# Patient Record
Sex: Male | Born: 1955
Health system: Southern US, Community
[De-identification: ages and names within clinical notes are randomized; demographics above are authoritative.]

## PROBLEM LIST (undated history)

## (undated) DIAGNOSIS — I10 Essential (primary) hypertension: Secondary | ICD-10-CM

## (undated) DIAGNOSIS — E785 Hyperlipidemia, unspecified: Secondary | ICD-10-CM

## (undated) DIAGNOSIS — M069 Rheumatoid arthritis, unspecified: Secondary | ICD-10-CM

## (undated) DIAGNOSIS — T7840XA Allergy, unspecified, initial encounter: Secondary | ICD-10-CM

## (undated) HISTORY — DX: Essential (primary) hypertension: I10

## (undated) HISTORY — DX: Rheumatoid arthritis, unspecified: M06.9

## (undated) HISTORY — DX: Allergy, unspecified, initial encounter: T78.40XA

## (undated) HISTORY — PX: HERNIA REPAIR: SHX51

## (undated) HISTORY — DX: Hyperlipidemia, unspecified: E78.5

---

## 2002-10-07 ENCOUNTER — Encounter: Admission: RE | Admit: 2002-10-07 | Discharge: 2002-10-07 | Payer: Self-pay | Admitting: Internal Medicine

## 2002-10-07 ENCOUNTER — Encounter: Payer: Self-pay | Admitting: Internal Medicine

## 2004-04-19 ENCOUNTER — Ambulatory Visit: Payer: Self-pay | Admitting: Family Medicine

## 2007-07-06 ENCOUNTER — Ambulatory Visit: Payer: Self-pay | Admitting: Family Medicine

## 2007-07-06 DIAGNOSIS — L509 Urticaria, unspecified: Secondary | ICD-10-CM | POA: Insufficient documentation

## 2007-07-06 DIAGNOSIS — Z9889 Other specified postprocedural states: Secondary | ICD-10-CM | POA: Insufficient documentation

## 2007-07-19 ENCOUNTER — Encounter (INDEPENDENT_AMBULATORY_CARE_PROVIDER_SITE_OTHER): Payer: Self-pay | Admitting: *Deleted

## 2007-07-24 ENCOUNTER — Ambulatory Visit: Payer: Self-pay | Admitting: Gastroenterology

## 2007-08-01 LAB — HM COLONOSCOPY: HM Colonoscopy: NORMAL

## 2007-08-07 ENCOUNTER — Ambulatory Visit: Payer: Self-pay | Admitting: Gastroenterology

## 2007-08-07 ENCOUNTER — Encounter: Payer: Self-pay | Admitting: Family Medicine

## 2007-09-11 ENCOUNTER — Ambulatory Visit: Payer: Self-pay | Admitting: Internal Medicine

## 2007-09-11 ENCOUNTER — Telehealth: Payer: Self-pay | Admitting: Internal Medicine

## 2007-09-11 ENCOUNTER — Observation Stay (HOSPITAL_COMMUNITY): Admission: EM | Admit: 2007-09-11 | Discharge: 2007-09-12 | Payer: Self-pay | Admitting: Emergency Medicine

## 2007-09-11 DIAGNOSIS — R42 Dizziness and giddiness: Secondary | ICD-10-CM

## 2007-09-11 DIAGNOSIS — R55 Syncope and collapse: Secondary | ICD-10-CM

## 2007-09-11 DIAGNOSIS — I10 Essential (primary) hypertension: Secondary | ICD-10-CM | POA: Insufficient documentation

## 2007-09-18 ENCOUNTER — Ambulatory Visit: Payer: Self-pay | Admitting: Internal Medicine

## 2007-09-18 DIAGNOSIS — R030 Elevated blood-pressure reading, without diagnosis of hypertension: Secondary | ICD-10-CM

## 2007-10-19 ENCOUNTER — Ambulatory Visit: Payer: Self-pay | Admitting: Family Medicine

## 2007-10-22 ENCOUNTER — Encounter: Payer: Self-pay | Admitting: Family Medicine

## 2007-10-22 ENCOUNTER — Encounter (INDEPENDENT_AMBULATORY_CARE_PROVIDER_SITE_OTHER): Payer: Self-pay | Admitting: *Deleted

## 2007-11-02 ENCOUNTER — Encounter (INDEPENDENT_AMBULATORY_CARE_PROVIDER_SITE_OTHER): Payer: Self-pay | Admitting: *Deleted

## 2008-08-29 ENCOUNTER — Ambulatory Visit: Payer: Self-pay | Admitting: Family Medicine

## 2008-08-29 DIAGNOSIS — E785 Hyperlipidemia, unspecified: Secondary | ICD-10-CM

## 2008-09-01 ENCOUNTER — Encounter (INDEPENDENT_AMBULATORY_CARE_PROVIDER_SITE_OTHER): Payer: Self-pay | Admitting: *Deleted

## 2008-09-01 ENCOUNTER — Encounter: Payer: Self-pay | Admitting: Family Medicine

## 2008-09-10 ENCOUNTER — Encounter (INDEPENDENT_AMBULATORY_CARE_PROVIDER_SITE_OTHER): Payer: Self-pay | Admitting: *Deleted

## 2008-09-15 ENCOUNTER — Ambulatory Visit: Payer: Self-pay | Admitting: Family Medicine

## 2008-09-16 ENCOUNTER — Encounter (INDEPENDENT_AMBULATORY_CARE_PROVIDER_SITE_OTHER): Payer: Self-pay | Admitting: *Deleted

## 2008-10-23 ENCOUNTER — Telehealth: Payer: Self-pay | Admitting: Family Medicine

## 2008-11-03 ENCOUNTER — Telehealth: Payer: Self-pay | Admitting: Family Medicine

## 2008-12-05 ENCOUNTER — Telehealth (INDEPENDENT_AMBULATORY_CARE_PROVIDER_SITE_OTHER): Payer: Self-pay | Admitting: *Deleted

## 2008-12-22 ENCOUNTER — Telehealth (INDEPENDENT_AMBULATORY_CARE_PROVIDER_SITE_OTHER): Payer: Self-pay | Admitting: *Deleted

## 2008-12-22 DIAGNOSIS — R21 Rash and other nonspecific skin eruption: Secondary | ICD-10-CM

## 2008-12-31 ENCOUNTER — Encounter: Payer: Self-pay | Admitting: Family Medicine

## 2009-03-23 ENCOUNTER — Telehealth (INDEPENDENT_AMBULATORY_CARE_PROVIDER_SITE_OTHER): Payer: Self-pay | Admitting: *Deleted

## 2009-11-24 ENCOUNTER — Ambulatory Visit: Payer: Self-pay | Admitting: Family Medicine

## 2009-12-14 ENCOUNTER — Ambulatory Visit: Payer: Self-pay | Admitting: Diagnostic Radiology

## 2009-12-14 ENCOUNTER — Ambulatory Visit: Payer: Self-pay | Admitting: Family Medicine

## 2009-12-14 ENCOUNTER — Ambulatory Visit (HOSPITAL_BASED_OUTPATIENT_CLINIC_OR_DEPARTMENT_OTHER): Admission: RE | Admit: 2009-12-14 | Discharge: 2009-12-14 | Payer: Self-pay | Admitting: Family Medicine

## 2009-12-14 DIAGNOSIS — M545 Low back pain: Secondary | ICD-10-CM

## 2009-12-29 ENCOUNTER — Ambulatory Visit: Payer: Self-pay | Admitting: Family Medicine

## 2010-02-17 ENCOUNTER — Ambulatory Visit: Payer: Self-pay | Admitting: Family Medicine

## 2010-02-17 ENCOUNTER — Encounter: Payer: Self-pay | Admitting: Family Medicine

## 2010-02-17 DIAGNOSIS — D239 Other benign neoplasm of skin, unspecified: Secondary | ICD-10-CM | POA: Insufficient documentation

## 2010-02-17 DIAGNOSIS — F528 Other sexual dysfunction not due to a substance or known physiological condition: Secondary | ICD-10-CM

## 2010-02-19 LAB — CONVERTED CEMR LAB
Sex Hormone Binding: 34 nmol/L (ref 13–71)
Testosterone-% Free: 2 % (ref 1.6–2.9)

## 2010-03-03 ENCOUNTER — Ambulatory Visit: Payer: Self-pay | Admitting: Family Medicine

## 2010-03-03 DIAGNOSIS — T7840XA Allergy, unspecified, initial encounter: Secondary | ICD-10-CM | POA: Insufficient documentation

## 2010-03-16 ENCOUNTER — Ambulatory Visit: Payer: Self-pay | Admitting: Family Medicine

## 2010-04-15 ENCOUNTER — Telehealth (INDEPENDENT_AMBULATORY_CARE_PROVIDER_SITE_OTHER): Payer: Self-pay | Admitting: *Deleted

## 2010-06-08 ENCOUNTER — Ambulatory Visit
Admission: RE | Admit: 2010-06-08 | Discharge: 2010-06-08 | Payer: Self-pay | Source: Home / Self Care | Attending: Family Medicine | Admitting: Family Medicine

## 2010-06-13 LAB — CONVERTED CEMR LAB
ALT: 41 units/L (ref 0–53)
Albumin: 4.5 g/dL (ref 3.5–5.2)
Albumin: 4.5 g/dL (ref 3.5–5.2)
Alkaline Phosphatase: 73 units/L (ref 39–117)
BUN: 10 mg/dL (ref 6–23)
BUN: 10 mg/dL (ref 6–23)
Basophils Absolute: 0 10*3/uL (ref 0.0–0.1)
Basophils Relative: 0.2 % (ref 0.0–3.0)
Basophils Relative: 0.3 % (ref 0.0–3.0)
Bilirubin, Direct: 0.1 mg/dL (ref 0.0–0.3)
Blood in Urine, dipstick: NEGATIVE
Blood in Urine, dipstick: NEGATIVE
Chloride: 106 meq/L (ref 96–112)
Cholesterol: 182 mg/dL (ref 0–200)
Eosinophils Absolute: 0.2 10*3/uL (ref 0.0–0.6)
Eosinophils Absolute: 0.2 10*3/uL (ref 0.0–0.7)
Eosinophils Relative: 3.5 % (ref 0.0–5.0)
Eosinophils Relative: 3.8 % (ref 0.0–5.0)
GFR calc non Af Amer: 75 mL/min
Glucose, Bld: 97 mg/dL (ref 70–99)
HCT: 44.6 % (ref 39.0–52.0)
HCT: 44.8 % (ref 39.0–52.0)
HDL: 33.7 mg/dL — ABNORMAL LOW (ref >39.0)
Hemoglobin: 15.1 g/dL (ref 13.0–17.0)
Hemoglobin: 15.6 g/dL (ref 13.0–17.0)
Ketones, urine, test strip: NEGATIVE
Lymphs Abs: 1 10*3/uL (ref 0.7–4.0)
MCHC: 33.8 g/dL (ref 30.0–36.0)
MCHC: 35.3 g/dL (ref 30.0–36.0)
MCV: 92.2 fL (ref 78.0–100.0)
MCV: 92.5 fL (ref 78.0–100.0)
MCV: 92.7 fL (ref 78.0–100.0)
Monocytes Absolute: 0.4 10*3/uL (ref 0.1–1.0)
Monocytes Absolute: 0.4 10*3/uL (ref 0.1–1.0)
Monocytes Absolute: 0.5 10*3/uL (ref 0.2–0.7)
Monocytes Relative: 9.3 % (ref 3.0–11.0)
Neutro Abs: 3 10*3/uL (ref 1.4–7.7)
Neutrophils Relative %: 61 % (ref 43.0–77.0)
Nitrite: NEGATIVE
Nitrite: NEGATIVE
PSA: 1.15 ng/mL (ref 0.10–4.00)
PSA: 1.27 ng/mL (ref 0.10–4.00)
Platelets: 333 10*3/uL (ref 150.0–400.0)
Potassium: 4.3 meq/L (ref 3.5–5.1)
Potassium: 4.4 meq/L (ref 3.5–5.1)
Protein, U semiquant: NEGATIVE
Protein, U semiquant: NEGATIVE
RBC: 4.75 M/uL (ref 4.22–5.81)
RBC: 4.84 M/uL (ref 4.22–5.81)
Sodium: 140 meq/L (ref 135–145)
Specific Gravity, Urine: 1.01
TSH: 1.89 microintl units/mL (ref 0.35–5.50)
TSH: 2.29 microintl units/mL (ref 0.35–5.50)
Total Bilirubin: 0.8 mg/dL (ref 0.3–1.2)
Total Bilirubin: 1 mg/dL (ref 0.3–1.2)
Total CHOL/HDL Ratio: 5.4
Total Protein: 7.6 g/dL (ref 6.0–8.3)
Urobilinogen, UA: NEGATIVE
Urobilinogen, UA: NEGATIVE
WBC Urine, dipstick: NEGATIVE
WBC: 4.4 10*3/uL — ABNORMAL LOW (ref 4.5–10.5)

## 2010-06-15 NOTE — Assessment & Plan Note (Signed)
Summary: lower back pain/cbs   Vital Signs:  Patient profile:   55 year old male Height:      66.50 inches (168.91 cm) Weight:      183.38 pounds (83.35 kg) BMI:     29.26 Temp:     98.3 degrees F (36.83 degrees C) oral BP sitting:   140 / 88  (right arm) Cuff size:   regular  Vitals Entered By: Lucious Groves CMA (December 14, 2009 11:56 AM) CC: C/O lower back pain since yesterday./kb, Back Pain Is Patient Diabetic? No Pain Assessment Patient in pain? yes     Location: back Intensity: 8 Type: aching Onset of pain  yesterday Comments Patient states that he injured himself while coughing./kb   History of Present Illness:       This is a 55 year old man who presents with Back Pain.  The symptoms began 1 day ago.  Pt c/o sudden onset back pain after coughing and feeling pop in back yesterday.  The patient denies fever, chills, weakness, loss of sensation, fecal incontinence, urinary incontinence, urinary retention, dysuria, rest pain, inability to work, and inability to care for self.  The pain is located in the mid low back.  The pain began at home, suddenly, and after straining.  The pain radiates to the left hip.  The pain is made worse by activity.  The pain is made better by inactivity and ice.    Current Medications (verified): 1)  None 2)  Flexeril 10 Mg Tabs (Cyclobenzaprine Hcl) .Marland Kitchen.. 1 By Mouth Three Times A Day 3)  Vicodin Es 7.5-750 Mg Tabs (Hydrocodone-Acetaminophen) .Marland Kitchen.. 1 By Mouth Every 6 Hours As Needed  Allergies (verified): No Known Drug Allergies  Past History:  Past medical, surgical, family and social histories (including risk factors) reviewed for relevance to current acute and chronic problems.  Past Medical History: Reviewed history from 08/29/2008 and no changes required. Urticaria Current Problems:  HYPERTENSION, ESSENTIAL NOS (ICD-401.9) DIZZINESS (ICD-780.4) VERTIGO (ICD-780.4) SYNCOPE (ICD-780.2) NAUSEA (ICD-787.02) VOMITING  (ICD-787.03) PREVENTIVE HEALTH CARE (ICD-V70.0) FAMILY HISTORY DIABETES 1ST DEGREE RELATIVE (ICD-V18.0) HERNIORRHAPHY, HX OF (ICD-V45.89) URTICARIA (ICD-708.9) Hyperlipidemia  Past Surgical History: Reviewed history from 09/18/2007 and no changes required. Herniorrhaphy  Family History: Reviewed history from 08/29/2008 and no changes required. F--Oral CAncer, angioplasty-- expired from cancer Family History Diabetes 1st degree relative M--dementia  Social History: Reviewed history from 07/06/2007 and no changes required. 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   Neurologic:  alert & oriented X3, strength normal in all extremities, gait normal, and DTRs symmetrical and normal.  + SLR b/L  Psych:  Oriented X3 and normally interactive.     Impression & Recommendations:  Problem # 1:  LOW BACK PAIN, ACUTE (ICD-724.2)  His updated medication list for this problem includes:    Flexeril 10 Mg Tabs (Cyclobenzaprine hcl) .Marland Kitchen... 1 by mouth three times a day    Vicodin Es 7.5-750 Mg Tabs (Hydrocodone-acetaminophen) .Marland Kitchen... 1 by mouth every 6 hours as needed  Orders: T-Lumbar Spine 2 Views (72100TC)  Discussed use of moist heat or ice, modified activities, medications, and stretching/strengthening exercises. Back care instructions given. To be seen in 2 weeks if no improvement; sooner  if worsening of symptoms.   Complete Medication List: 1)  None  2)  Flexeril 10 Mg Tabs (Cyclobenzaprine hcl) .Marland Kitchen.. 1 by mouth three times a day 3)  Vicodin Es 7.5-750 Mg Tabs (Hydrocodone-acetaminophen) .Marland Kitchen.. 1 by mouth every 6 hours as needed  Patient  Instructions: 1)  Most patients (90%) with low back pain will improve with time ( 2-6 weeks). Keep active but avoid activities that are painful. Apply moist heat and/or ice to lower back several times a day.  Prescriptions: VICODIN ES 7.5-750 MG TABS (HYDROCODONE-ACETAMINOPHEN) 1 by mouth every 6 hours as needed  #30 x 0   Entered and Authorized by:   Loreen Freud DO   Signed by:   Loreen Freud DO on 12/14/2009   Method used:   Print then Give to Patient   RxID:   [id number] FLEXERIL 10 MG TABS (CYCLOBENZAPRINE HCL) 1 by mouth three times a day  #30 x 0   Entered and Authorized by:   Loreen Freud DO   Signed by:   Loreen Freud DO on 12/14/2009   Method used:   Print then Give to Patient   RxID:   [id number]

## 2010-06-15 NOTE — Assessment & Plan Note (Signed)
Summary: 2ND HEP   Nurse Visit   Allergies: No Known Drug Allergies  Immunizations Administered:  TwinRix # 2:    Vaccine Type: TwinRix    Site: left deltoid    Mfr: GlaxoSmithKline    Dose: 1.0 ml    Route: IM    Given by: Almeta Monas CMA (AAMA)    Exp. Date: 07/25/2011    Lot #: [id number]    VIS given: 02/01/07 version given December 29, 2009.  Orders Added: 1)  TwinRix 1ml ( Hep A&B Adult dose) [90636] 2)  Admin 1st Vaccine [90471]

## 2010-06-15 NOTE — Progress Notes (Signed)
Summary: refill   Phone Note Refill Request Message from:  Patient on April 15, 2010 11:37 AM  Refills Requested: Medication #1:  CRESTOR 10 MG TABS 1 by mouth at bedtime. patient was given samples of crestor - he said med seems to be agreeing - wants 3 ,month supply - [facility]  Initial call taken by: Okey Regal [place],  April 15, 2010 11:40 AM    Prescriptions: CRESTOR 10 MG TABS (ROSUVASTATIN CALCIUM) 1 by mouth at bedtime  #90 x 1   Entered by:   Almeta Monas CMA (AAMA)   Authorized by:   Loreen Freud DO   Signed by:   Almeta Monas CMA (AAMA) on 04/15/2010   Method used:   Faxed to ...       [facility] education [facility])       [address] [address]       [place], [place]  [address]       Ph: [phone]       Fax: [phone]   RxID:   [id number]

## 2010-06-15 NOTE — Assessment & Plan Note (Signed)
Summary: cpx / /cbs   Vital Signs:  Patient profile:   55 year old male Height:      66.50 inches Weight:      186.8 pounds BMI:     29.81 Temp:     98.7 degrees F oral Pulse rate:   68 / minute Pulse rhythm:   regular BP sitting:   122 / 80  (right arm) Cuff size:   regular  Vitals Entered By: Almeta Monas CMA Duncan Dull) (February 17, 2010 9:12 AM) CC: CPX/Fasting   History of Present Illness: Pt here for cpe.  No complaints.    Preventive Screening-Counseling & Management  Alcohol-Tobacco     Alcohol drinks/day: 3     Alcohol type: beer     >5/day in last 3 mos: yes     Alcohol Counseling: to decrease amount and/or frequency of alcohol intake     Feels need to cut down: no     Feels annoyed by complaints: no     Feels guilty re: drinking: no     Needs 'eye opener' in am: no     Smoking Status: never     Passive Smoke Exposure: no  Caffeine-Diet-Exercise     Caffeine use/day: 2     Caffeine Counseling: not indicated; caffeine use is not excessive or problematic     Does Patient Exercise: no     Exercise Counseling: to improve exercise regimen  Hep-HIV-STD-Contraception     Hepatitis Risk: no risk noted     Hepatitis Risk Counseling: not indicated-no hepatitis risk noted     HIV Risk: no risk noted     HIV Risk Counseling: not indicated-no HIV risk noted     STD Risk: no risk noted     STD Risk Counseling: not indicated-no STD risk noted     Dental Visit-last 6 months yes     Dental Care Counseling: not indicated; dental care within six months     Sun Exposure-Excessive: occasionally  Safety-Violence-Falls     Seat Belt Use: yes     Firearms in the Home: no firearms in the home     Smoke Detectors: yes     Violence in the Home: no risk noted     Sexual Abuse: no      Sexual History:  currently monogamous.    Current Medications (verified): 1)  None 2)  Cialis 20 Mg Tabs (Tadalafil) .... As Directed  Allergies (verified): No Known Drug Allergies  Past  History:  Past Medical History: Last updated: 08/29/2008 Urticaria Current Problems:  HYPERTENSION, ESSENTIAL NOS (ICD-401.9) DIZZINESS (ICD-780.4) VERTIGO (ICD-780.4) SYNCOPE (ICD-780.2) NAUSEA (ICD-787.02) VOMITING (ICD-787.03) PREVENTIVE HEALTH CARE (ICD-V70.0) FAMILY HISTORY DIABETES 1ST DEGREE RELATIVE (ICD-V18.0) HERNIORRHAPHY, HX OF (ICD-V45.89) URTICARIA (ICD-708.9) Hyperlipidemia  Past Surgical History: Last updated: 09/18/2007 Herniorrhaphy  Family History: Last updated: 08/29/2008 F--Oral CAncer, angioplasty-- expired from cancer Family History Diabetes 1st degree relative M--dementia  Social History: Last updated: 07/06/2007 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 F--Oral CAncer, angioplasty-- expired from cancer Family History Diabetes 1st degree relative M--dementia  Social History: Reviewed history from 07/06/2007 and no changes required. 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 Eyes:  Denies blurring, discharge, double vision, eye irritation, eye pain, halos, itching, light sensitivity, red eye, vision loss-1 eye, and vision loss-both eyes. ENT:  Denies decreased hearing, difficulty swallowing, ear discharge, earache, hoarseness, nasal congestion, nosebleeds, postnasal drainage, ringing in ears, sinus pressure, and sore throat. CV:  Denies bluish discoloration of  lips or nails, chest pain or discomfort, difficulty breathing at night, difficulty breathing while lying down, fainting, fatigue, leg cramps with exertion, lightheadness, near fainting, palpitations, shortness of breath with exertion, swelling of feet, swelling of hands, and weight gain. Resp:  Denies chest discomfort, chest pain with inspiration, cough, coughing up blood, excessive snoring, hypersomnolence, morning headaches, pleuritic, shortness of breath, sputum productive, and wheezing. GI:  Denies abdominal pain, bloody stools, change in bowel habits, constipation, dark tarry stools, diarrhea, excessive appetite, gas, hemorrhoids, indigestion, loss of appetite, and nausea. GU:  Denies decreased libido, discharge, dysuria, erectile dysfunction, genital sores, hematuria, incontinence, nocturia, urinary frequency, and urinary hesitancy. MS:  Denies joint pain, joint redness, joint swelling, loss of strength, low back pain, mid back pain, muscle aches, muscle , cramps, muscle weakness, stiffness, and thoracic pain. Derm:  Denies changes in color of skin, changes in nail beds, dryness, excessive perspiration, flushing, hair loss, insect bite(s), itching, lesion(s), poor wound healing, and rash. Neuro:  Denies brief paralysis, difficulty with concentration, disturbances in coordination, falling down, headaches, inability to speak, memory loss, numbness, poor balance, seizures, sensation of room spinning, tingling, tremors, visual disturbances, and weakness. Psych:  Denies alternate hallucination ( auditory/visual), anxiety, depression, easily angered, easily tearful, irritability, mental problems, panic attacks, sense of great danger, suicidal thoughts/plans, thoughts of violence, unusual visions or sounds, and thoughts /plans of harming others. Endo:  Denies cold intolerance, excessive hunger, excessive thirst, excessive urination, heat intolerance, polyuria, and weight change. Heme:  Denies abnormal  bruising, bleeding, enlarge lymph nodes, fevers, pallor, and skin discoloration. Allergy:  Denies hives or rash, itching eyes, persistent infections, seasonal allergies, and sneezing.  Physical Exam  General:  Well-developed,well-nourished,in no acute distress; alert,appropriate and cooperative throughout examination Head:  Normocephalic and atraumatic without obvious abnormalities. No apparent alopecia or balding. Eyes:  No corneal or conjunctival inflammation noted. EOMI. Perrla. Funduscopic exam benign, without hemorrhages, exudates or papilledema. Vision grossly normal. Ears:  External ear exam shows no significant lesions or deformities.  Otoscopic examination reveals clear canals, tympanic membranes are intact bilaterally without bulging, retraction, inflammation or discharge. Hearing is grossly normal bilaterally. Nose:  External nasal examination shows no deformity or inflammation. Nasal mucosa are pink and moist without lesions or exudates. Mouth:  Oral mucosa and oropharynx without lesions or exudates.  Teeth in good repair. Neck:  No deformities, masses, or tenderness noted.no carotid bruits.   Chest Wall:  No deformities, masses, tenderness or gynecomastia noted. Lungs:  Normal respiratory effort, chest expands symmetrically. Lungs are clear to auscultation, no crackles or wheezes. Heart:  Normal rate and regular rhythm. S1 and S2 normal without gallop, murmur, click, rub or other extra sounds. Abdomen:  Bowel sounds positive,abdomen soft and non-tender without masses, organomegaly or hernias noted. Rectal:  No external abnormalities noted. Normal sphincter tone. No rectal masses or tenderness. Genitalia:  Testes bilaterally descended without nodularity, tenderness or masses. No scrotal masses or lesions. No penis lesions or urethral discharge. Prostate:  Prostate gland firm and smooth, no enlargement, nodularity,  tenderness, mass, asymmetry or induration. Msk:  No deformity or  scoliosis noted of thoracic or lumbar spine.   Pulses:  R and L carotid,radial,femoral,dorsalis pedis and posterior tibial pulses are full and equal bilaterally Extremities:  No clubbing, cyanosis, edema, or deformity noted with normal full range of motion of all joints.   Neurologic:  No cranial nerve deficits noted. Station and gait are normal. Plantar reflexes are down-going bilaterally. DTRs are symmetrical throughout. Sensory, motor and coordinative functions appear intact. Skin:  Intact without suspicious lesions or rashes Cervical Nodes:  No lymphadenopathy noted Psych:  Cognition and judgment appear intact. Alert and cooperative with normal attention span and concentration. No apparent delusions, illusions, hallucinations   Impression & Recommendations:  Problem # 1:  PREVENTIVE HEALTH CARE (ICD-V70.0) ghm utd  Orders: Venipuncture (29562) TLB-PSA (Prostate Specific Antigen) (84153-PSA) T- * Misc. Laboratory test [phone]) TLB-CBC Platelet - w/Differential (85025-CBCD) TLB-TSH (Thyroid Stimulating Hormone) (84443-TSH) EKG w/ Interpretation (93000) UA Dipstick W/ Micro (manual) (57846)  Reviewed preventive care protocols, scheduled due services, and updated immunizations.  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 As directed  Orders: T- * Misc. Laboratory test [phone]) EKG w/ Interpretation (93000)  Discussed proper use of medications, as well as side effects.   Problem # 3:  NEVI, MULTIPLE (ICD-216.9)  Orders: EKG w/ Interpretation (93000) Dermatology Referral (Derma)  Problem # 4:  HYPERLIPIDEMIA (ICD-272.4) pt stopped med secondary to flushing Orders: Venipuncture (28413) TLB-PSA (Prostate Specific Antigen) (84153-PSA) T- * Misc. Laboratory test [phone]) EKG w/ Interpretation (93000)  Labs Reviewed: SGOT: 18 (08/29/2008)   SGPT: 41 (08/29/2008)    HDL:33.7 (07/06/2007)  LDL:125 (07/06/2007)  Chol:182 (07/06/2007)  Trig:116 (07/06/2007)  Problem # 5:  HYPERTENSION, ESSENTIAL NOS (ICD-401.9) Assessment: Improved  Orders: Venipuncture (02725) TLB-PSA (Prostate Specific Antigen) (84153-PSA) T- * Misc. Laboratory test [phone])  BP today: 122/80 Prior BP: 140/88 (12/14/2009)  Labs Reviewed: K+: 4.3 (08/29/2008) Creat: : 0.9 (08/29/2008)   Chol: 182 (07/06/2007)   HDL: 33.7 (07/06/2007)   LDL: 125 (07/06/2007)   TG: 116 (07/06/2007)  Complete Medication List: 1)  None  2)  Cialis 20 Mg Tabs (Tadalafil) .... As directed  Other Orders: Admin 1st Vaccine (03474) Flu Vaccine 7yrs + (25956) Flu Vaccine Consent Questions     Do you have a history of severe allergic reactions to this vaccine? no    Any prior history of allergic reactions to egg and/or gelatin? no    Do you have a sensitivity to the preservative Thimersol? no    Do you have a past history of Guillan-Barre Syndrome? no    Do you currently have an acute febrile illness? no    Have you ever had a severe reaction to latex? no    Vaccine information given and explained to patient? yes    Are you currently pregnant? no    Lot Number:[id number]   Exp Date:11/13/2010   Site Given  Left Deltoid IM Admin 1st Vaccine (38756) Flu Vaccine 59yrs + (43329)  Patient Instructions: 1)  Please schedule a follow-up appointment in 2 weeks to review labs Prescriptions: CIALIS 20 MG TABS (TADALAFIL) as directed  #3 x 3   Entered and Authorized by:   Loreen Freud DO   Signed by:   Loreen Freud DO on 02/17/2010   Method used:   Print then Give to Patient   RxID:   [id number]  .  lbflu  Last Flu Vaccine:  Fluvax 3+ (03/21/2007 9:50:41 AM) Flu Vaccine Result Date:  02/17/2010 Flu Vaccine Result:  given Flu Vaccine Next Due:  1 yr Last Hemoccult Result: normal (08/29/2008 8:30:12 AM) Hemoccult Result Date:  02/17/2010 Hemoccult Result:  normal Hemoccult Next Due:  1  yr      Laboratory Results   Urine Tests   Date/Time Reported: February 17, 2010 12:50 PM   Routine Urinalysis   Color: yellow Appearance: Clear Glucose: negative   (Normal Range: Negative) Bilirubin: negative   (Normal Range: Negative) Ketone: negative   (Normal Range: Negative) Spec. Gravity: 1.020   (Normal Range: 1.003-1.035) Blood: negative   (Normal Range: Negative) pH: 5.0   (Normal Range: 5.0-8.0) Protein: negative   (Normal Range: Negative) Urobilinogen: negative   (Normal Range: 0-1) Nitrite: negative   (Normal Range: Negative) Leukocyte Esterace: negative   (Normal Range: Negative)    Comments: Floydene Flock  February 17, 2010 12:50 PM

## 2010-06-15 NOTE — Assessment & Plan Note (Signed)
Summary: GOING TO [place] - NEEDS IMMUNIZATION/CBS   Vital Signs:  Patient profile:   55 year old male Height:      66.50 inches Weight:      184 pounds BMI:     29.36 Temp:     98.9 degrees F oral Pulse rate:   68 / minute BP sitting:   130 / 90  (left arm)  Vitals Entered By: Jeremy Johann CMA (November 24, 2009 10:39 AM) CC: update immunization Comments REVIEWED MED LIST, PATIENT AGREED DOSE AND INSTRUCTION CORRECT    History of Present Illness: Pt here to discuss travel to [place].  Pt has a list of vaccines he needs.    Current Medications (verified): 1)  None 2)  Vivotif Berna Vaccine  Cpdr (Typhoid Vaccine) .Marland Kitchen.. 1 Cap Every Other Day X 4 Doses 3)  Malarone 250-100 Mg Tabs (Atovaquone-Proguanil Hcl) .Marland Kitchen.. 1 By Mouth Once Daily --Start 1-2 Days Before Leaving and Cont Until 7 Days After Returning 4)  Cipro 500 Mg Tabs (Ciprofloxacin Hcl) .Marland Kitchen.. 1 By Mouth Two Times A Day 5)  Promethazine Hcl 25 Mg Tabs (Promethazine Hcl) .Marland Kitchen.. 1 By Mouth Qid As Needed Nausea/vomiting  Allergies (verified): No Known Drug Allergies  Past History:  Past medical, surgical, family and social histories (including risk factors) reviewed for relevance to current acute and chronic problems.  Past Medical History: Reviewed history from 08/29/2008 and no changes required. Urticaria Current Problems:  HYPERTENSION, ESSENTIAL NOS (ICD-401.9) DIZZINESS (ICD-780.4) VERTIGO (ICD-780.4) SYNCOPE (ICD-780.2) NAUSEA (ICD-787.02) VOMITING (ICD-787.03) PREVENTIVE HEALTH CARE (ICD-V70.0) FAMILY HISTORY DIABETES 1ST DEGREE RELATIVE (ICD-V18.0) HERNIORRHAPHY, HX OF (ICD-V45.89) URTICARIA (ICD-708.9) Hyperlipidemia  Past Surgical History: Reviewed history from 09/18/2007 and no changes required. Herniorrhaphy  Family History: Reviewed history from 08/29/2008 and no changes required. F--Oral CAncer, angioplasty-- expired from cancer Family History Diabetes 1st degree relative M--dementia  Social  History: Reviewed history from 07/06/2007 and no changes required. 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 Alert and cooperative with normal attention span and concentration. No apparent delusions, illusions, hallucinations   Impression & Recommendations:  Problem # 1:  NEED PROPH VACC AGAINST OTH SPEC VACC (ICD-V03.89) typhoid written twinrix given #1 boosterix given malaria proph given  pt will need to go to travel clinic to get yellow fever and polio  Complete Medication List: 1)  None  2)  Vivotif Berna Vaccine Cpdr (Typhoid vaccine) .Marland Kitchen.. 1 cap every other day x 4 doses 3)  Malarone 250-100 Mg Tabs (Atovaquone-proguanil hcl) .Marland Kitchen.. 1 by mouth once daily --start 1-2 days before leaving and cont until 7 days after returning 4)  Cipro 500 Mg Tabs (Ciprofloxacin hcl) .Marland Kitchen.. 1 by mouth two times a day 5)  Promethazine Hcl 25 Mg Tabs (Promethazine hcl) .Marland Kitchen.. 1 by mouth qid as needed nausea/vomiting Prescriptions: PROMETHAZINE HCL 25 MG TABS (PROMETHAZINE HCL) 1 by mouth qid as needed nausea/vomiting  #30 x 0   Entered and Authorized by:   Loreen Freud DO   Signed by:   Loreen Freud DO on 11/24/2009   Method used:   Electronically to        [facility] [phone][facility] (retail)       [address]       [place]  [place], [place]  [address]       Ph: [phone]       Fax: [phone]   RxID:   [id number] CIPRO 500 MG TABS (CIPROFLOXACIN HCL) 1 by mouth two times a day  #10 x 0   Entered and Authorized by:   Loreen Freud DO   Signed by:   Loreen Freud DO on 11/24/2009   Method used:   [facility]ically to        [facility] [phone]* (retail)       [address]       [place], [place]  [address]       Ph: [phone]       Fax: [phone]   RxID:   [id number] VIVOTIF BERNA VACCINE  CPDR (TYPHOID VACCINE) 1 cap every other day x 4 doses  #4 x 0   Entered and Authorized by:   Loreen Freud DO   Signed by:   Loreen Freud DO on 11/24/2009   Method used:   [facility] to        [facility] [phone]* (retail)       [address]       [place], [place]  [address]       Ph: [phone]       Fax: [phone]   RxID:   [id number] MALARONE 250-100 MG TABS (ATOVAQUONE-PROGUANIL HCL) 1 by mouth once daily --start 1-2 days before leaving and cont until 7 days after returning  #20 x 0   Entered and Authorized by:   Loreen Freud DO   Signed by:   Loreen Freud DO on 11/24/2009   Method used:   Electronically to        [facility] [phone]* (retail)       [address]       [place], [place]  [address]       Ph: [phone]       Fax: [phone]   RxID:   [id number] VIVOTIF BERNA VACCINE  CPDR (TYPHOID VACCINE) 1 cap every other day x 4 doses  #4 x 0   Entered and Authorized by:   Loreen Freud DO   Signed by:   Loreen Freud DO on 11/24/2009   Method used:   Print then Give to Patient   RxID:   [id number]   Appended Document: GOING TO [place] - NEEDS IMMUNIZATION/CBS   Tetanus/Td Vaccine    Vaccine Type: Tdap    Site: right deltoid    Mfr: GlaxoSmithKline    Dose: 0.5 ml    Route: IM    Given by: Jeremy Johann CMA    Exp. Date: 11/29/2010    Lot #: [id number]    VIS given: 04/03/07 version given November 24, 2009.  TwinRix # 1    Vaccine Type: TwinRix    Site: left deltoid    Mfr: GlaxoSmithKline    Dose: 1ml    Given by: Jeremy Johann CMA    Exp. Date: 07/25/2011    Lot #: [id number]    VIS given: 02/01/07 version given November 24, 2009.

## 2010-06-15 NOTE — Assessment & Plan Note (Signed)
Summary: 2 WEEK FOLLOWUP///SPH   Vital Signs:  Patient profile:   55 year old male Weight:      188.6 pounds Temp:     98.0 degrees F oral Pulse rate:   68 / minute Pulse rhythm:   regular BP sitting:   120 / 76  (right arm) Cuff size:   regular  Vitals Entered By: Almeta Monas CMA Duncan Dull) (March 03, 2010 10:44 AM) CC: 2 wk f/u to review labs, URI symptoms   History of Present Illness: Pt was here to review labs but the [facility] was not in.         This is a 55 year old man who presents with URI symptoms.  Pt c/o L ear pain on occassion and some sinus pressure on the Left off and on for years.  The patient complains of earache.  The patient denies fever, low-grade fever (<100.5 degrees), fever of 100.5-103 degrees, fever of 103.1-104 degrees, fever to >104 degrees, stiff neck, dyspnea, wheezing, rash, vomiting, diarrhea, use of an antipyretic, and response to antipyretic.  The patient denies itchy watery eyes, itchy throat, sneezing, seasonal symptoms, response to antihistamine, headache, muscle aches, and severe fatigue.  The patient denies the following risk factors for Strep sinusitis: unilateral facial pain, unilateral nasal discharge, poor response to decongestant, double sickening, tooth pain, Strep exposure, tender adenopathy, and absence of cough.    Current Medications (verified): 1)  None 2)  Cialis 20 Mg Tabs (Tadalafil) .... As Directed 3)  Veramyst 27.5 Mcg/spray Susp (Fluticasone Furoate) .... 2 Sprays Each Nostril Once Daily 4)  Zyrtec Hives Relief 10 Mg Tabs (Cetirizine Hcl) .Marland Kitchen.. 1 By Mouth Once Daily  Allergies (verified): No Known Drug Allergies  Past History:  Past medical, surgical, family and social histories (including risk factors) reviewed for relevance to current acute and chronic problems.  Past Medical History: Reviewed history from 08/29/2008 and no changes required. Urticaria Current Problems:  HYPERTENSION, ESSENTIAL NOS  (ICD-401.9) DIZZINESS (ICD-780.4) VERTIGO (ICD-780.4) SYNCOPE (ICD-780.2) NAUSEA (ICD-787.02) VOMITING (ICD-787.03) PREVENTIVE HEALTH CARE (ICD-V70.0) FAMILY HISTORY DIABETES 1ST DEGREE RELATIVE (ICD-V18.0) HERNIORRHAPHY, HX OF (ICD-V45.89) URTICARIA (ICD-708.9) Hyperlipidemia  Past Surgical History: Reviewed history from 09/18/2007 and no changes required. Herniorrhaphy  Family History: Reviewed history from 08/29/2008 and no changes required. F--Oral CAncer, angioplasty-- expired from cancer Family History Diabetes 1st degree relative M--dementia  Social History: Reviewed history from 07/06/2007 and no changes required. 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  Otoscopic examination reveals clear canals, tympanic membranes are intact bilaterally without bulging, retraction, inflammation or discharge. Hearing is grossly normal bilaterally. Nose:  no external deformity and no external erythema.   Mouth:  Oral mucosa and oropharynx without lesions or exudates.  Teeth in good repair. Neck:  No deformities, masses, or tenderness noted. Lungs:  Normal respiratory effort, chest expands symmetrically. Lungs are clear to auscultation, no crackles or wheezes. Heart:  Normal rate and regular rhythm. S1 and S2 normal without gallop, murmur, click, rub or other extra sounds. Extremities:  No clubbing, cyanosis, edema, or deformity noted with normal full range of motion of all joints.   Skin:  Intact without suspicious lesions or rashes Psych:  Oriented X3 and normally interactive.     Impression &  Recommendations:  Problem # 1:  ALLERGY UNSPECIFIED NOT ELSEWHERE CLASSIFIED  (ICD-995.3) otc zyrtec and veramyst rto prn Orders: Venipuncture (04540) T-Allergy Profile Region [place], [place], [place], [place], [place] [phone])  Complete Medication List: 1)  None  2)  Cialis 20 Mg Tabs (Tadalafil) .... As directed 3)  Veramyst 27.5 Mcg/spray Susp (Fluticasone furoate) .... 2 sprays each nostril once daily 4)  Zyrtec Hives Relief 10 Mg Tabs (Cetirizine hcl) .Marland Kitchen.. 1 by mouth once daily   Orders Added: 1)  Venipuncture [91478] 2)  T-Allergy Profile Region [place], [place], [place], , [place] [2956] 3)  Est. Patient Level III [id number]

## 2010-06-15 NOTE — Assessment & Plan Note (Signed)
Summary: to discuss labs///sph   Vital Signs:  Patient profile:   55 year old male Weight:      189.0 pounds Pulse rate:   68 / minute Pulse rhythm:   regular BP sitting:   128 / 80  (left arm) Cuff size:   regular  Vitals Entered By: Almeta Monas CMA Duncan Dull) (March 16, 2010 10:41 AM) CC: F/u to review [facility]   History of Present Illness: Pt here to review labs only.  No new complaints.  Current Medications (verified): 1)  None 2)  Cialis 20 Mg Tabs (Tadalafil) .... As Directed 3)  Veramyst 27.5 Mcg/spray Susp (Fluticasone Furoate) .... 2 Sprays Each Nostril Once Daily 4)  Zyrtec Hives Relief 10 Mg Tabs (Cetirizine Hcl) .Marland Kitchen.. 1 By Mouth Once Daily  Allergies (verified): No Known Drug Allergies  Past History:  Past Medical History: Last updated: 08/29/2008 Urticaria Current Problems:  HYPERTENSION, ESSENTIAL NOS (ICD-401.9) DIZZINESS (ICD-780.4) VERTIGO (ICD-780.4) SYNCOPE (ICD-780.2) NAUSEA (ICD-787.02) VOMITING (ICD-787.03) PREVENTIVE HEALTH CARE (ICD-V70.0) FAMILY HISTORY DIABETES 1ST DEGREE RELATIVE (ICD-V18.0) HERNIORRHAPHY, HX OF (ICD-V45.89) URTICARIA (ICD-708.9) Hyperlipidemia  Past Surgical History: Last updated: 09/18/2007 Herniorrhaphy  Family History: Last updated: 08/29/2008 F--Oral CAncer, angioplasty-- expired from cancer Family History Diabetes 1st degree relative M--dementia  Social History: Last updated: 07/06/2007 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 F--Oral CAncer, angioplasty-- expired from cancer Family History Diabetes 1st degree  relative M--dementia  Social History: Reviewed history from 07/06/2007 and no changes required. 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     Impression & Recommendations:  Problem # 1:  HYPERLIPIDEMIA (ICD-272.4) reviewed [facility] with pt recheck 3 months His updated medication list for this problem includes:    Crestor 10 Mg Tabs (Rosuvastatin calcium) .Marland Kitchen... 1 by mouth at bedtime  Labs Reviewed: SGOT: 18 (08/29/2008)   SGPT: 41 (08/29/2008)   HDL:33.7 (07/06/2007)  LDL:125 (07/06/2007)  Chol:182 (07/06/2007)  Trig:116 (07/06/2007)  Complete Medication List: 1)  None  2)  Cialis 20 Mg Tabs (Tadalafil) .... As directed 3)  Veramyst 27.5 Mcg/spray Susp (Fluticasone furoate) .... 2 sprays each nostril once daily 4)  Zyrtec Hives Relief 10 Mg Tabs (Cetirizine hcl) .Marland Kitchen.. 1 by mouth once daily 5)  Crestor 10 Mg Tabs (Rosuvastatin calcium) .Marland Kitchen.. 1 by mouth at bedtime  Patient Instructions: 1)  fasting labs in 3 months----[facility] 272.4   Orders Added: 1)  Est. Patient Level III [16109]

## 2010-06-17 NOTE — Assessment & Plan Note (Signed)
Summary: 3rd hep b/kn   Nurse Visit   Allergies: No Known Drug Allergies

## 2010-06-17 NOTE — Assessment & Plan Note (Signed)
Summary: nurse visit   Allergies: No Known Drug Allergies   Complete Medication List: 1)  None  2)  Cialis 20 Mg Tabs (Tadalafil) .... As directed 3)  Veramyst 27.5 Mcg/spray Susp (Fluticasone furoate) .... 2 sprays each nostril once daily 4)  Zyrtec Hives Relief 10 Mg Tabs (Cetirizine hcl) .Marland Kitchen.. 1 by mouth once daily 5)  Crestor 10 Mg Tabs (Rosuvastatin calcium) .Marland Kitchen.. 1 by mouth at bedtime  Other Orders: TwinRix 1ml ( Hep A&B Adult dose) (16109) Admin 1st Vaccine (60454)   Orders Added: 1)  TwinRix 1ml ( Hep A&B Adult dose) [90636] 2)  Admin 1st Vaccine [90471]   Immunizations Administered:  TwinRix # 3:    Vaccine Type: TwinRix    Site: left deltoid    Mfr: GlaxoSmithKline    Dose: 1.0 ml    Route: IM    Given by: Jeremy Johann CMA    Exp. Date: 02/20/2012    Lot #: [id number]    VIS given: 02/01/07 version given June 08, 2010.   Immunizations Administered:  TwinRix # 3:    Vaccine Type: TwinRix    Site: left deltoid    Mfr: GlaxoSmithKline    Dose: 1.0 ml    Route: IM    Given by: Jeremy Johann CMA    Exp. Date: 02/20/2012    Lot #: [id number]    VIS given: 02/01/07 version given June 08, 2010.

## 2010-07-09 ENCOUNTER — Other Ambulatory Visit: Payer: BC Managed Care – PPO

## 2010-08-10 ENCOUNTER — Telehealth: Payer: Self-pay

## 2010-08-10 MED ORDER — ROSUVASTATIN CALCIUM 20 MG PO TABS: 20.0000 mg | ORAL_TABLET | Freq: Every day | ORAL | Status: DC

## 2010-08-10 NOTE — Telephone Encounter (Signed)
Pt aware Rx sent to pharmacy.Felecia Darsh Vandevoort CMA  

## 2010-08-10 NOTE — Telephone Encounter (Signed)
Called patient about [facility] results---He needs to increase Crestor to 20 mg at bedtime and add Fish Oil recheck Lipid and Hep 272.4 in 3 months per Dr.Lowne       KP

## 2010-08-27 ENCOUNTER — Encounter: Payer: Self-pay | Admitting: Internal Medicine

## 2010-08-27 ENCOUNTER — Encounter: Payer: Self-pay | Admitting: Family Medicine

## 2010-09-28 NOTE — Discharge Summary (Signed)
Timothy Huynh, Timothy Huynh                 ACCOUNT NO.:  [id number]   MEDICAL RECORD NO.:  [id number]          PATIENT TYPE:  INP   LOCATION:  3703                         FACILITY:  [facility]   PHYSICIAN:  Timothy Huynh, MDDATE OF BIRTH:  08-Aug-1955   DATE OF ADMISSION:  09/11/2007  DATE OF DISCHARGE:  09/12/2007                               DISCHARGE SUMMARY   DISCHARGE DIAGNOSIS:  Recurrent dizziness with episode of nausea and  vomiting on September 11, 2007, likely secondary to vertigo.   HISTORY OF PRESENT ILLNESS:  Timothy Huynh is a 55 year old white male with  no significant past medical history who presents on September 11, 2007, with  chief complaint of feeling nauseated and dizzy.  He also notes some  tingling in his hands and arms and had an episode of vomiting.  This  followed eating a sub at [employer].  He also noticed sweating and a  clammy feeling.  He was admitted for further evaluation and treatment.  He had a similar episode on the week prior to this admission which  lasted for 30 seconds with nausea and dizziness, admitted for further  evaluation and treatment.   COURSE OF HOSPITALIZATION.:  Recurrent dizziness likely secondary to  vertigo.  The patient was admitted and underwent serial cardiac enzymes  which were negative x3.  Lab work was unrevealing with the exception of  mild leukocytosis with a white blood cell count 13,000 on admission.  He  has remained afebrile.  He is currently tolerating p.o. and has no  further complaints of dizziness.  Orthostatic blood pressure checks were  unremarkable.  He was maintained on telemetry, maintained normal sinus  rhythm with heart rate between 50 and 60.  A 12-lead EKG notes first-  degree AV block.  At this time, the patient's symptoms seem most  consistent with vertigo.  We will give him a trial of meclizine and  arrange follow-up with his primary care.  If no improvement with the  addition of meclizine, consider further workup.   We will differ it to  the patient's primary MD.   MEDICATIONS AT TIME OF DISCHARGE:  Meclizine 25 mg p.o. q.8 h. p.r.n.  dizziness.   DISPOSITION:  Plan discharge the patient to home.   FOLLOW-UP:  The patient is scheduled to follow up with Dr. Marga Melnick on Tuesday, Sep 18, 2007 at 1:30 p.m.  He is instructed to go to  the emergency department should he develop worsening dizziness not  relieved by meclizine, inability to keep down food or water, fever  greater than 101, chest pain, shortness of breath, or fainting.     Timothy Huynh, Timothy Huynh      Timothy Rover. Timothy Coyer, MD  Electronically Signed   MO/MEDQ  D:  09/12/2007  T:  09/13/2007  Job:  [id number]   cc:   Timothy Dubin. Alwyn Ren, MD,FACP,FCCP

## 2010-10-01 NOTE — Consult Note (Signed)
Timothy Huynh, Timothy Huynh                 ACCOUNT NO.:  [id number]   MEDICAL RECORD NO.:  [id number]          PATIENT TYPE:  OBV   LOCATION:  3703                         FACILITY:  [facility]   PHYSICIAN:  Timothy Dubin. Hopper, MD,FACP,FCCPDATE OF BIRTH:  [birth year]   DATE OF CONSULTATION:  DATE OF DISCHARGE:  09/12/2007                                 CONSULTATION   Dear Timothy Huynh,   This letter is an attempt to clarify whether the Observation admission  from September 11, 2007 to September 12, 2007, to [facility] for [name] was covered by your insurance company.  I have received an  initial letter saying that coverage has been declined.   The patient appeared in our office emergently and had to be taken to an  exam room in a wheelchair because of profound imbalance.  He was gagging  incessantly and profoundly weak.  It took 2 individuals to get him on to  the exam table.  His skin was damp, and he demonstrated blood pressure  of 170/100.  He exhibited near syncope on several occasions in the  office.   He was transported by non-emergency ambulance to the hospital.  Because  of lack of beds, he was held in the emergency room.  Labs revealed a  white count of 14,300 with a left shift.  Potassium initially was low at  3.4 and initial glucose was 151.   He received parenteral medications for nausea and was monitored on  telemetry.  Cardiac enzymes were monitored and were negative for acute  cardiac insult.   Within 24 hours, he stabilized and was discharged.   I have practiced Internal Medicine for over 3 decades including over 20  years of service as a Pulmonary Critical Care physician.  It is my  clinical opinion that this patient was too unstable to have been sent  home.He had an extremely high risk for fall  with  associated risk for  orthopedic or neurologic injury.  Because of his clinical instability,  not to observe this man and verify that there was no acute cardiac or  neurologic process would represent substandard care in my opinion.   I will ask our Medical Peer Review Committee to review his care &  request review by the [facility]  as well if  your determination remains unchanged after review of this letter of  appeal. Thank you for your consideration Timothy Huynh M.D.,FACP      Timothy Dubin. Alwyn Ren, MD,FACP,FCCP  Electronically Signed     [facility]/MEDQ  D:  12/01/2007  T:  12/02/2007  Job:  [id number]   cc:   Valinda Hoar [place] [employer] [place] [employer]  [facility] Medical Peer Review

## 2010-11-16 ENCOUNTER — Encounter: Payer: Self-pay | Admitting: Family Medicine

## 2010-11-19 ENCOUNTER — Other Ambulatory Visit (INDEPENDENT_AMBULATORY_CARE_PROVIDER_SITE_OTHER): Payer: BC Managed Care – PPO

## 2010-11-19 DIAGNOSIS — E785 Hyperlipidemia, unspecified: Secondary | ICD-10-CM

## 2010-11-19 LAB — LIPID PANEL
HDL: 46.3 mg/dL (ref >39.00)
Triglycerides: 304 mg/dL — ABNORMAL HIGH (ref 0.0–149.0)

## 2010-11-19 LAB — HEPATIC FUNCTION PANEL
ALT: 41 U/L (ref 0–53)
AST: 20 U/L (ref 0–37)
Bilirubin, Direct: 0 mg/dL (ref 0.0–0.3)
Total Bilirubin: 0.3 mg/dL (ref 0.3–1.2)
Total Protein: 7.5 g/dL (ref 6.0–8.3)

## 2010-11-19 LAB — LDL CHOLESTEROL, DIRECT: Direct LDL: 72.3 mg/dL

## 2010-11-19 NOTE — Progress Notes (Signed)
Labs only

## 2010-11-22 ENCOUNTER — Other Ambulatory Visit: Payer: BC Managed Care – PPO

## 2010-11-22 NOTE — Progress Notes (Signed)
Labs only

## 2010-11-30 ENCOUNTER — Telehealth: Payer: Self-pay | Admitting: *Deleted

## 2010-11-30 ENCOUNTER — Encounter: Payer: Self-pay | Admitting: *Deleted

## 2010-11-30 MED ORDER — FENOFIBRATE MICRONIZED 130 MG PO CAPS: 130.0000 mg | ORAL_CAPSULE | Freq: Every day | ORAL | Status: DC

## 2010-11-30 NOTE — Telephone Encounter (Signed)
Discuss with patient, copy of labs mailed, Rx sent to pharmacy. 

## 2010-11-30 NOTE — Telephone Encounter (Signed)
Message copied by Verdene Rio on Tue Nov 30, 2010  3:59 PM ------      Message from: Lelon Perla      Created: Sun Nov 28, 2010 12:42 PM       Cholesterol--- LDL goal < 100,  HDL >49,  TG < 150.  Diet and exercise will increase HDL and decrease LDL and TG.  Fish,  Fish Oil, Flaxseed oil will also help increase the HDL and decrease Triglycerides.   Recheck labs in 3 months.  Con't Crestor and add antara 130mg   1 po qd  #30 ,  2 refills----272.4  Lipid, hep

## 2010-12-06 ENCOUNTER — Telehealth: Payer: Self-pay | Admitting: *Deleted

## 2010-12-06 MED ORDER — FENOFIBRATE 160 MG PO TABS: 160.0000 mg | ORAL_TABLET | Freq: Every day | ORAL | Status: DC

## 2010-12-06 NOTE — Telephone Encounter (Signed)
Fenofibrate 160 mg 1 po qd #30  2 refills 

## 2010-12-06 NOTE — Telephone Encounter (Signed)
Faxed states that antara 130mg  not covered without Prior Auth. Alternative med covered: Generic Fenofibrate 54 mg, 67 mg, 134 mg, 160 mg, and 200 mg.Please advise on PA or med change

## 2010-12-06 NOTE — Telephone Encounter (Signed)
Rx faxed, form faxed back as well.

## 2010-12-23 ENCOUNTER — Telehealth: Payer: Self-pay | Admitting: Family Medicine

## 2010-12-23 NOTE — Telephone Encounter (Signed)
Error

## 2010-12-26 ENCOUNTER — Other Ambulatory Visit: Payer: Self-pay | Admitting: Family Medicine

## 2010-12-27 NOTE — Telephone Encounter (Signed)
rx sent to pharmacy

## 2011-02-08 LAB — BASIC METABOLIC PANEL
BUN: 9
CO2: 25
Chloride: 104
Creatinine, Ser: 0.95
GFR calc Af Amer: >60
GFR calc Af Amer: >60
GFR calc non Af Amer: >60
Glucose, Bld: 98
Potassium: 3.4 — ABNORMAL LOW
Sodium: 137

## 2011-02-08 LAB — HEPATIC FUNCTION PANEL
AST: 16
Albumin: 4
Total Bilirubin: 0.6

## 2011-02-08 LAB — CK TOTAL AND CKMB (NOT AT ARMC)
CK, MB: 1.8
Relative Index: INVALID

## 2011-02-08 LAB — DIFFERENTIAL
Basophils Absolute: 0
Basophils Relative: 0
Eosinophils Absolute: 0
Monocytes Relative: 6
Neutro Abs: 11.8 — ABNORMAL HIGH
Neutrophils Relative %: 88 — ABNORMAL HIGH

## 2011-02-08 LAB — PROTIME-INR
INR: 1
Prothrombin Time: 13.3

## 2011-02-08 LAB — APTT: aPTT: 24

## 2011-02-08 LAB — CARDIAC PANEL(CRET KIN+CKTOT+MB+TROPI)
CK, MB: 2.3
Total CK: 161
Troponin I: 0.02

## 2011-02-08 LAB — CBC
Platelets: 288
WBC: 13.3 — ABNORMAL HIGH

## 2011-04-14 ENCOUNTER — Other Ambulatory Visit: Payer: Self-pay | Admitting: Family Medicine

## 2011-04-14 DIAGNOSIS — E785 Hyperlipidemia, unspecified: Secondary | ICD-10-CM

## 2011-04-15 ENCOUNTER — Other Ambulatory Visit (INDEPENDENT_AMBULATORY_CARE_PROVIDER_SITE_OTHER): Payer: BC Managed Care – PPO

## 2011-04-15 DIAGNOSIS — E785 Hyperlipidemia, unspecified: Secondary | ICD-10-CM

## 2011-04-15 LAB — LIPID PANEL
Cholesterol: 153 mg/dL (ref 0–200)
HDL: 43.2 mg/dL (ref >39.00)
Triglycerides: 199 mg/dL — ABNORMAL HIGH (ref 0.0–149.0)

## 2011-04-15 LAB — HEPATIC FUNCTION PANEL
ALT: 50 U/L (ref 0–53)
AST: 24 U/L (ref 0–37)
Albumin: 4.7 g/dL (ref 3.5–5.2)
Alkaline Phosphatase: 51 U/L (ref 39–117)
Total Protein: 7.8 g/dL (ref 6.0–8.3)

## 2011-04-15 NOTE — Progress Notes (Signed)
12  

## 2011-04-25 ENCOUNTER — Ambulatory Visit (INDEPENDENT_AMBULATORY_CARE_PROVIDER_SITE_OTHER): Payer: BC Managed Care – PPO | Admitting: Family Medicine

## 2011-04-25 ENCOUNTER — Encounter: Payer: Self-pay | Admitting: Family Medicine

## 2011-04-25 VITALS — BP 130/80 | HR 74 | Temp 98.2°F | Wt 186.0 lb

## 2011-04-25 DIAGNOSIS — J329 Chronic sinusitis, unspecified: Secondary | ICD-10-CM

## 2011-04-25 DIAGNOSIS — M549 Dorsalgia, unspecified: Secondary | ICD-10-CM

## 2011-04-25 MED ORDER — FENOFIBRATE 160 MG PO TABS: 160.0000 mg | ORAL_TABLET | Freq: Every day | ORAL | Status: DC

## 2011-04-25 MED ORDER — CYCLOBENZAPRINE HCL 10 MG PO TABS: 10.0000 mg | ORAL_TABLET | Freq: Three times a day (TID) | ORAL | Status: AC | PRN

## 2011-04-25 MED ORDER — ROSUVASTATIN CALCIUM 20 MG PO TABS: 20.0000 mg | ORAL_TABLET | Freq: Every day | ORAL | Status: DC

## 2011-04-25 MED ORDER — CEFUROXIME AXETIL 500 MG PO TABS: 500.0000 mg | ORAL_TABLET | Freq: Two times a day (BID) | ORAL | Status: AC

## 2011-04-25 MED ORDER — AZELASTINE-FLUTICASONE 137-50 MCG/ACT NA SUSP: 1.0000 | Freq: Two times a day (BID) | NASAL | Status: DC

## 2011-04-25 NOTE — Progress Notes (Signed)
  Subjective:     Timothy Huynh is a 55 y.o. male who presents for evaluation of possible sinusitis. Symptoms include congestion, facial pain, nasal congestion, no  fever, productive cough with  green colored sputum, sinus pressure and wheezing. Onset of symptoms was 10 days ago, and has been gradually worsening since that time. Treatment to date: antihistamines, cough suppressants and decongestants.  The following portions of the patient's history were reviewed and updated as appropriate: allergies, current medications, past family history, past medical history, past social history, past surgical history and problem list.  Review of Systems Pertinent items are noted in HPI.   Objective:    BP 130/80  Pulse 74  Temp(Src) 98.2 F (36.8 C) (Oral)  Wt 186 lb (84.369 kg)  SpO2 97% General appearance: alert, cooperative, appears stated age and no distress Ears: abnormal TM left ear - erythematous Nose: green discharge, mild congestion, sinus tenderness bilateral Throat: abnormal findings: mild oropharyngeal erythema Neck: mild anterior cervical adenopathy, supple, symmetrical, trachea midline and thyroid not enlarged, symmetric, no tenderness/mass/nodules Lungs: clear to auscultation bilaterally Heart: regular rate and rhythm, S1, S2 normal, no murmur, click, rub or gallop   Assessment:    sinusitis   Plan:    Discussed the diagnosis and treatment of sinusitis. Suggested symptomatic OTC remedies. Nasal saline spray for congestion. Ceftin per orders. Nasal steroids per orders. Follow up as needed.

## 2011-04-25 NOTE — Patient Instructions (Signed)

## 2011-07-11 ENCOUNTER — Ambulatory Visit (INDEPENDENT_AMBULATORY_CARE_PROVIDER_SITE_OTHER): Payer: BC Managed Care – PPO | Admitting: Family Medicine

## 2011-07-11 ENCOUNTER — Encounter: Payer: Self-pay | Admitting: Family Medicine

## 2011-07-11 VITALS — BP 118/70 | HR 77 | Temp 98.8°F | Wt 189.8 lb

## 2011-07-11 DIAGNOSIS — J329 Chronic sinusitis, unspecified: Secondary | ICD-10-CM

## 2011-07-11 MED ORDER — AMOXICILLIN-POT CLAVULANATE 875-125 MG PO TABS: 1.0000 | ORAL_TABLET | Freq: Two times a day (BID) | ORAL | Status: AC

## 2011-07-11 MED ORDER — BECLOMETHASONE DIPROPIONATE 80 MCG/ACT NA AERS: 2.0000 | INHALATION_SPRAY | Freq: Every day | NASAL | Status: DC

## 2011-07-11 NOTE — Patient Instructions (Signed)

## 2011-07-11 NOTE — Progress Notes (Signed)
  Subjective:     Timothy Huynh is a 56 y.o. male who presents for evaluation of sinus pain. Symptoms include: congestion, headaches, nasal congestion, post nasal drip, purulent rhinorrhea, sinus pressure and sore throat. Onset of symptoms was 3 days ago. Symptoms have been gradually worsening since that time. Past history is significant for no history of pneumonia or bronchitis. Patient is a non-smoker.  The following portions of the patient's history were reviewed and updated as appropriate: allergies, current medications, past family history, past medical history, past social history, past surgical history and problem list.  Review of Systems Pertinent items are noted in HPI.   Objective:    BP 118/70  Pulse 77  Temp(Src) 98.8 F (37.1 C) (Oral)  Wt 189 lb 12.8 oz (86.093 kg)  SpO2 98% General appearance: alert, cooperative, appears stated age and no distress Ears: normal TM's and external ear canals both ears Nose: green discharge, moderate congestion, turbinates red, swollen, edematous, sinus tenderness bilateral Throat: abnormal findings: mild oropharyngeal erythema and pnd Neck: mild anterior cervical adenopathy, supple, symmetrical, trachea midline and thyroid not enlarged, symmetric, no tenderness/mass/nodules Lungs: clear to auscultation bilaterally Heart: S1, S2 normal Lymph nodes: Cervical adenopathy: b/l    Assessment:    Acute bacterial sinusitis.    Plan:    Nasal steroids per medication orders. Antihistamines per medication orders. Augmentin per medication orders. f/u prn

## 2011-07-26 ENCOUNTER — Encounter: Payer: BC Managed Care – PPO | Admitting: Family Medicine

## 2011-08-08 ENCOUNTER — Encounter: Payer: BC Managed Care – PPO | Admitting: Family Medicine

## 2011-08-18 ENCOUNTER — Encounter: Payer: Self-pay | Admitting: Family Medicine

## 2011-08-18 ENCOUNTER — Other Ambulatory Visit (INDEPENDENT_AMBULATORY_CARE_PROVIDER_SITE_OTHER): Payer: BC Managed Care – PPO

## 2011-08-18 ENCOUNTER — Ambulatory Visit (INDEPENDENT_AMBULATORY_CARE_PROVIDER_SITE_OTHER): Payer: BC Managed Care – PPO | Admitting: Family Medicine

## 2011-08-18 VITALS — BP 118/76 | HR 60 | Temp 98.1°F | Ht 68.0 in | Wt 183.4 lb

## 2011-08-18 DIAGNOSIS — E785 Hyperlipidemia, unspecified: Secondary | ICD-10-CM

## 2011-08-18 DIAGNOSIS — T7840XA Allergy, unspecified, initial encounter: Secondary | ICD-10-CM

## 2011-08-18 DIAGNOSIS — I10 Essential (primary) hypertension: Secondary | ICD-10-CM

## 2011-08-18 DIAGNOSIS — Z0000 Encounter for general adult medical examination without abnormal findings: Secondary | ICD-10-CM

## 2011-08-18 DIAGNOSIS — N529 Male erectile dysfunction, unspecified: Secondary | ICD-10-CM

## 2011-08-18 DIAGNOSIS — F528 Other sexual dysfunction not due to a substance or known physiological condition: Secondary | ICD-10-CM

## 2011-08-18 LAB — CBC WITH DIFFERENTIAL/PLATELET
Basophils Absolute: 0 10*3/uL (ref 0.0–0.1)
Hemoglobin: 13.8 g/dL (ref 13.0–17.0)
Lymphocytes Relative: 24.2 % (ref 12.0–46.0)
Lymphs Abs: 1.3 10*3/uL (ref 0.7–4.0)
MCHC: 34.3 g/dL (ref 30.0–36.0)
Monocytes Absolute: 0.4 10*3/uL (ref 0.1–1.0)
RBC: 4.38 Mil/uL (ref 4.22–5.81)

## 2011-08-18 LAB — POCT URINALYSIS DIPSTICK
Ketones, UA: NEGATIVE
Leukocytes, UA: NEGATIVE
Nitrite, UA: NEGATIVE
Protein, UA: NEGATIVE
pH, UA: 5

## 2011-08-18 LAB — HEPATIC FUNCTION PANEL
ALT: 43 U/L (ref 0–53)
AST: 26 U/L (ref 0–37)
Alkaline Phosphatase: 47 U/L (ref 39–117)
Bilirubin, Direct: 0.1 mg/dL (ref 0.0–0.3)
Total Bilirubin: 0.7 mg/dL (ref 0.3–1.2)

## 2011-08-18 LAB — TSH: TSH: 1.9 u[IU]/mL (ref 0.35–5.50)

## 2011-08-18 LAB — BASIC METABOLIC PANEL
CO2: 29 mEq/L (ref 19–32)
Calcium: 9.6 mg/dL (ref 8.4–10.5)
Chloride: 101 mEq/L (ref 96–112)
Potassium: 3.9 mEq/L (ref 3.5–5.1)
Sodium: 139 mEq/L (ref 135–145)

## 2011-08-18 MED ORDER — SILDENAFIL CITRATE 50 MG PO TABS: 50.0000 mg | ORAL_TABLET | Freq: Every day | ORAL | Status: DC | PRN

## 2011-08-18 NOTE — Assessment & Plan Note (Signed)
Pt requesting Allergy referral for testing

## 2011-08-18 NOTE — Assessment & Plan Note (Signed)
Check labs con't meds 

## 2011-08-18 NOTE — Assessment & Plan Note (Signed)
Check labs rx viagra

## 2011-08-18 NOTE — Patient Instructions (Signed)
Preventive Care for Adults, Male A healthy lifestyle and preventative care can promote health and wellness. Preventative health guidelines for men include the following key practices:  A routine yearly physical is a good way to check with your caregiver about your health and preventative screening. It is a chance to share any concerns and updates on your health, and to receive a thorough exam.   Visit your dentist for a routine exam and preventative care every 6 months. Brush your teeth twice a day and floss once a day. Good oral hygiene prevents tooth decay and gum disease.   The frequency of eye exams is based on your age, health, family medical history, use of contact lenses, and other factors. Follow your caregiver's recommendations for frequency of eye exams.   Eat a healthy diet. Foods like vegetables, fruits, whole grains, low-fat dairy products, and lean protein foods contain the nutrients you need without too many calories. Decrease your intake of foods high in solid fats, added sugars, and salt. Eat the right amount of calories for you.Get information about a proper diet from your caregiver, if necessary.   Regular physical exercise is one of the most important things you can do for your health. Most adults should get at least 150 minutes of moderate-intensity exercise (any activity that increases your heart rate and causes you to sweat) each week. In addition, most adults need muscle-strengthening exercises on 2 or more days a week.   Maintain a healthy weight. The body mass index (BMI) is a screening tool to identify possible weight problems. It provides an estimate of body fat based on height and weight. Your caregiver can help determine your BMI, and can help you achieve or maintain a healthy weight.For adults 20 years and older:   A BMI below 18.5 is considered underweight.   A BMI of 18.5 to 24.9 is normal.   A BMI of 25 to 29.9 is considered overweight.   A BMI of 30 and above  is considered obese.   Maintain normal blood lipids and cholesterol levels by exercising and minimizing your intake of saturated fat. Eat a balanced diet with plenty of fruit and vegetables. Blood tests for lipids and cholesterol should begin at age 20 and be repeated every 5 years. If your lipid or cholesterol levels are high, you are over 50, or you are a high risk for heart disease, you may need your cholesterol levels checked more frequently.Ongoing high lipid and cholesterol levels should be treated with medicines if diet and exercise are not effective.   If you smoke, find out from your caregiver how to quit. If you do not use tobacco, do not start.   If you choose to drink alcohol, do not exceed 2 drinks per day. One drink is considered to be 12 ounces (355 mL) of beer, 5 ounces (148 mL) of wine, or 1.5 ounces (44 mL) of liquor.   Avoid use of street drugs. Do not share needles with anyone. Ask for help if you need support or instructions about stopping the use of drugs.   High blood pressure causes heart disease and increases the risk of stroke. Your blood pressure should be checked at least every 1 to 2 years. Ongoing high blood pressure should be treated with medicines, if weight loss and exercise are not effective.   If you are 45 to 55 years old, ask your caregiver if you should take aspirin to prevent heart disease.   Diabetes screening involves taking a blood   sample to check your fasting blood sugar level. This should be done once every 3 years, after age 45, if you are within normal weight and without risk factors for diabetes. Testing should be considered at a younger age or be carried out more frequently if you are overweight and have at least 1 risk factor for diabetes.   Colorectal cancer can be detected and often prevented. Most routine colorectal cancer screening begins at the age of 50 and continues through age 75. However, your caregiver may recommend screening at an earlier  age if you have risk factors for colon cancer. On a yearly basis, your caregiver may provide home test kits to check for hidden blood in the stool. Use of a small camera at the end of a tube, to directly examine the colon (sigmoidoscopy or colonoscopy), can detect the earliest forms of colorectal cancer. Talk to your caregiver about this at age 50, when routine screening begins. Direct examination of the colon should be repeated every 5 to 10 years through age 75, unless early forms of pre-cancerous polyps or small growths are found.   Hepatitis C blood testing is recommended for all people born from 1945 through 1965 and any individual with known risks for hepatitis C.   Practice safe sex. Use condoms and avoid high-risk sexual practices to reduce the spread of sexually transmitted infections (STIs). STIs include gonorrhea, chlamydia, syphilis, trichomonas, herpes, HPV, and human immunodeficiency virus (HIV). Herpes, HIV, and HPV are viral illnesses that have no cure. They can result in disability, cancer, and death.   A one-time screening for abdominal aortic aneurysm (AAA) and surgical repair of large AAAs by sound wave imaging (ultrasonography) is recommended for ages 65 to 75 years who are current or former smokers.   Healthy men should no longer receive prostate-specific antigen (PSA) blood tests as part of routine cancer screening. Consult with your caregiver about prostate cancer screening.   Testicular cancer screening is not recommended for adult males who have no symptoms. Screening includes self-exam, caregiver exam, and other screening tests. Consult with your caregiver about any symptoms you have or any concerns you have about testicular cancer.   Use sunscreen with skin protection factor (SPF) of 30 or more. Apply sunscreen liberally and repeatedly throughout the day. You should seek shade when your shadow is shorter than you. Protect yourself by wearing long sleeves, pants, a  wide-brimmed hat, and sunglasses year round, whenever you are outdoors.   Once a month, do a whole body skin exam, using a mirror to look at the skin on your back. Notify your caregiver of new moles, moles that have irregular borders, moles that are larger than a pencil eraser, or moles that have changed in shape or color.   Stay current with required immunizations.   Influenza. You need a dose every fall (or winter). The composition of the flu vaccine changes each year, so being vaccinated once is not enough.   Pneumococcal polysaccharide. You need 1 to 2 doses if you smoke cigarettes or if you have certain chronic medical conditions. You need 1 dose at age 65 (or older) if you have never been vaccinated.   Tetanus, diphtheria, pertussis (Tdap, Td). Get 1 dose of Tdap vaccine if you are younger than age 65 years, are over 65 and have contact with an infant, are a healthcare worker, or simply want to be protected from whooping cough. After that, you need a Td booster dose every 10 years. Consult your caregiver if   you have not had at least 3 tetanus and diphtheria-containing shots sometime in your life or have a deep or dirty wound.   HPV. This vaccine is recommended for males 13 through 55 years of age. This vaccine may be given to men 22 through 55 years of age who have not completed the 3 dose series. It is recommended for men through age 26 who have sex with men or whose immune system is weakened because of HIV infection, other illness, or medications. The vaccine is given in 3 doses over 6 months.   Measles, mumps, rubella (MMR). You need at least 1 dose of MMR if you were born in 1957 or later. You may also need a 2nd dose.   Meningococcal. If you are age 19 to 21 years and a first-year college student living in a residence hall, or have one of several medical conditions, you need to get vaccinated against meningococcal disease. You may also need additional booster doses.   Zoster (shingles).  If you are age 60 years or older, you should get this vaccine.   Varicella (chickenpox). If you have never had chickenpox or you were vaccinated but received only 1 dose, talk to your caregiver to find out if you need this vaccine.   Hepatitis A. You need this vaccine if you have a specific risk factor for hepatitis A virus infection, or you simply wish to be protected from this disease. The vaccine is usually given as 2 doses, 6 to 18 months apart.   Hepatitis B. You need this vaccine if you have a specific risk factor for hepatitis B virus infection or you simply wish to be protected from this disease. The vaccine is given in 3 doses, usually over 6 months.  Preventative Service / Frequency Ages 19 to 39  Blood pressure check.** / Every 1 to 2 years.   Lipid and cholesterol check.** / Every 5 years beginning at age 20.   Hepatitis C blood test.** / For any individual with known risks for hepatitis C.   Skin self-exam. / Monthly.   Influenza immunization.** / Every year.   Pneumococcal polysaccharide immunization.** / 1 to 2 doses if you smoke cigarettes or if you have certain chronic medical conditions.   Tetanus, diphtheria, pertussis (Tdap,Td) immunization. / A one-time dose of Tdap vaccine. After that, you need a Td booster dose every 10 years.   HPV immunization. / 3 doses over 6 months, if 26 and younger.   Measles, mumps, rubella (MMR) immunization. / You need at least 1 dose of MMR if you were born in 1957 or later. You may also need a 2nd dose.   Meningococcal immunization. / 1 dose if you are age 19 to 21 years and a first-year college student living in a residence hall, or have one of several medical conditions, you need to get vaccinated against meningococcal disease. You may also need additional booster doses.   Varicella immunization.** / Consult your caregiver.   Hepatitis A immunization.** / Consult your caregiver. 2 doses, 6 to 18 months apart.   Hepatitis B  immunization.** / Consult your caregiver. 3 doses usually over 6 months.  Ages 40 to 64  Blood pressure check.** / Every 1 to 2 years.   Lipid and cholesterol check.** / Every 5 years beginning at age 20.   Fecal occult blood test (FOBT) of stool. / Every year beginning at age 50 and continuing until age 75. You may not have to do this test if   you get colonoscopy every 10 years.   Flexible sigmoidoscopy** or colonoscopy.** / Every 5 years for a flexible sigmoidoscopy or every 10 years for a colonoscopy beginning at age 50 and continuing until age 75.   Hepatitis C blood test.** / For all people born from 1945 through 1965 and any individual with known risks for hepatitis C.   Skin self-exam. / Monthly.   Influenza immunization.** / Every year.   Pneumococcal polysaccharide immunization.** / 1 to 2 doses if you smoke cigarettes or if you have certain chronic medical conditions.   Tetanus, diphtheria, pertussis (Tdap/Td) immunization.** / A one-time dose of Tdap vaccine. After that, you need a Td booster dose every 10 years.   Measles, mumps, rubella (MMR) immunization. / You need at least 1 dose of MMR if you were born in 1957 or later. You may also need a 2nd dose.   Varicella immunization.**/ Consult your caregiver.   Meningococcal immunization.** / Consult your caregiver.   Hepatitis A immunization.** / Consult your caregiver. 2 doses, 6 to 18 months apart.   Hepatitis B immunization.** / Consult your caregiver. 3 doses, usually over 6 months.  Ages 65 and over  Blood pressure check.** / Every 1 to 2 years.   Lipid and cholesterol check.**/ Every 5 years beginning at age 20.   Fecal occult blood test (FOBT) of stool. / Every year beginning at age 50 and continuing until age 75. You may not have to do this test if you get colonoscopy every 10 years.   Flexible sigmoidoscopy** or colonoscopy.** / Every 5 years for a flexible sigmoidoscopy or every 10 years for a colonoscopy  beginning at age 50 and continuing until age 75.   Hepatitis C blood test.** / For all people born from 1945 through 1965 and any individual with known risks for hepatitis C.   Abdominal aortic aneurysm (AAA) screening.** / A one-time screening for ages 65 to 75 years who are current or former smokers.   Skin self-exam. / Monthly.   Influenza immunization.** / Every year.   Pneumococcal polysaccharide immunization.** / 1 dose at age 65 (or older) if you have never been vaccinated.   Tetanus, diphtheria, pertussis (Tdap, Td) immunization. / A one-time dose of Tdap vaccine if you are over 65 and have contact with an infant, are a healthcare worker, or simply want to be protected from whooping cough. After that, you need a Td booster dose every 10 years.   Varicella immunization. ** / Consult your caregiver.   Meningococcal immunization.** / Consult your caregiver.   Hepatitis A immunization. ** / Consult your caregiver. 2 doses, 6 to 18 months apart.   Hepatitis B immunization.** / Check with your caregiver. 3 doses, usually over 6 months.  **Family history and personal history of risk and conditions may change your caregiver's recommendations. Document Released: 06/28/2001 Document Revised: 04/21/2011 Document Reviewed: 09/27/2010 [employer] Patient Information 2012 [employer]. 

## 2011-08-18 NOTE — Progress Notes (Signed)
Subjective:    Patient ID: Timothy Huynh, male    DOB: 1955/07/27, 56 y.o.   MRN: [id number]  HPI Pt here for cpe and labs.   Pt struggling with ED and is requesting testosterone level.  He also c/o allergic reaction to something--- on occasion he breaks out in rash on face and ext and it goes away with time and or antihistamine but pt would like to try to find out what he is allergic to.   Review of Systems Review of Systems  Constitutional: Negative for activity change, appetite change and fatigue.  HENT: Negative for hearing loss, congestion, tinnitus and ear discharge.  dentist q1m Eyes: Negative for visual disturbance (see optho q1y -- vision corrected to 20/20 with glasses).  Respiratory: Negative for cough, chest tightness and shortness of breath.   Cardiovascular: Negative for chest pain, palpitations and leg swelling.  Gastrointestinal: Negative for abdominal pain, diarrhea, constipation and abdominal distention.  Genitourinary: Negative for urgency, frequency, decreased urine volume and difficulty urinating.  Musculoskeletal: Negative for back pain, arthralgias and gait problem.  Skin: Negative for color change, pallor and rash.  Neurological: Negative for dizziness, light-headedness, numbness and headaches.  Hematological: Negative for adenopathy. Does not bruise/bleed easily.  Psychiatric/Behavioral: Negative for suicidal ideas, confusion, sleep disturbance, self-injury, dysphoric mood, decreased concentration and agitation.   . Family History  Problem Relation Age of Onset  . Cancer Father     Oral --angioplasty expired from cancer  . Diabetes Father   . Dementia Mother    History   Social History  . Marital Status: Married    Spouse Name: N/A    Number of Children: N/A  . Years of Education: N/A   Occupational History  . [employer]    Social History Main Topics  . Smoking status: Never Smoker   . Smokeless tobacco: Never Used  . Alcohol Use: Yes  . Drug  Use: No  . Sexually Active: Yes -- Male partner(s)   Other Topics Concern  . None   Social History Narrative   Exercise--- 2-3 x a week   Past Medical History  Diagnosis Date  . Hypertension   . Hyperlipidemia         Objective:   Physical Exam  BP 118/76  Pulse 60  Temp(Src) 98.1 F (36.7 C) (Oral)  Ht 5\' 8"  (1.727 m)  Wt 183 lb 6.4 oz (83.19 kg)  BMI 27.89 kg/m2  SpO2 97%  General Appearance:    Alert, cooperative, no distress, appears stated age  Head:    Normocephalic, without obvious abnormality, atraumatic  Eyes:    PERRL, conjunctiva/corneas clear, EOM's intact, fundi    benign, both eyes       Ears:    Normal TM's and external ear canals, both ears  Nose:   Nares normal, septum midline, mucosa normal, no drainage   or sinus tenderness  Throat:   Lips, mucosa, and tongue normal; teeth and gums normal  Neck:   Supple, symmetrical, trachea midline, no adenopathy;       thyroid:  No enlargement/tenderness/nodules; no carotid   bruit or JVD  Back:     Symmetric, no curvature, ROM normal, no CVA tenderness  Lungs:     Clear to auscultation bilaterally, respirations unlabored  Chest wall:    No tenderness or deformity  Heart:    Regular rate and rhythm, S1 and S2 normal, no murmur, rub   or gallop  Abdomen:     Soft, non-tender, bowel  sounds active all four quadrants,    no masses, no organomegaly  Genitalia:    Normal male without lesion, discharge or tenderness  Rectal:    Normal tone, normal prostate, no masses or tenderness;   guaiac negative stool  Extremities:   Extremities normal, atraumatic, no cyanosis or edema  Pulses:   2+ and symmetric all extremities  Skin:   Skin color, texture, turgor normal, no rashes or lesions  Lymph nodes:   Cervical, supraclavicular, and axillary nodes normal  Neurologic:   CNII-XII intact. Normal strength, sensation and reflexes      throughout        Assessment & Plan:

## 2011-08-19 ENCOUNTER — Other Ambulatory Visit: Payer: Self-pay | Admitting: Family Medicine

## 2011-08-19 LAB — TESTOSTERONE, FREE, TOTAL, SHBG
Testosterone-% Free: 2 % (ref 1.6–2.9)
Testosterone: 248.69 ng/dL — ABNORMAL LOW (ref 300–890)

## 2011-11-14 ENCOUNTER — Other Ambulatory Visit: Payer: Self-pay | Admitting: Family Medicine

## 2011-12-18 ENCOUNTER — Other Ambulatory Visit: Payer: Self-pay | Admitting: Family Medicine

## 2012-05-24 ENCOUNTER — Telehealth: Payer: Self-pay | Admitting: Family Medicine

## 2012-05-24 ENCOUNTER — Encounter: Payer: Self-pay | Admitting: Family Medicine

## 2012-05-24 ENCOUNTER — Ambulatory Visit (INDEPENDENT_AMBULATORY_CARE_PROVIDER_SITE_OTHER): Payer: Medicare HMO | Admitting: Family Medicine

## 2012-05-24 VITALS — BP 120/76 | HR 69 | Temp 98.0°F | Ht 67.5 in | Wt 184.2 lb

## 2012-05-24 DIAGNOSIS — J329 Chronic sinusitis, unspecified: Secondary | ICD-10-CM | POA: Insufficient documentation

## 2012-05-24 MED ORDER — AMOXICILLIN 875 MG PO TABS: 875.0000 mg | ORAL_TABLET | Freq: Two times a day (BID) | ORAL | Status: DC

## 2012-05-24 NOTE — Telephone Encounter (Signed)
Patient Information:  Caller Name: Tiler  Phone: [phone]  Patient: Timothy Huynh, Timothy Huynh  Gender: Male  DOB: 1955/10/15  Age: 57 Years  PCP: Lelon Perla.  Office Follow Up:  Does the office need to follow up with this patient?: No  Instructions For The Office: N/A   Symptoms  Reason For Call & Symptoms: Nasal congestion, drainage, chest congestion, productive cough at times, mild sore throat. Reports mucus is green. Reports increased fatigue also.  Reviewed Health History In EMR: Yes  Reviewed Medications In EMR: Yes  Reviewed Allergies In EMR: Yes  Reviewed Surgeries / Procedures: Yes  Date of Onset of Symptoms: 05/17/2012  Treatments Tried: Sudafed, Alkaseltzer cold medicine  Treatments Tried Worked: Yes  Guideline(s) Used:  Sinus Pain and Congestion  Disposition Per Guideline:   See Today or Tomorrow in Office  Reason For Disposition Reached:   Using nasal washes and pain medicine > 24 hours and sinus pain (lower forehead, cheekbone, or eye) persists  Advice Given:  Call Back If:   You become worse.  Appointment Scheduled:  05/24/2012 09:45:00 Appointment Scheduled Provider:  Sheliah Hatch

## 2012-05-24 NOTE — Patient Instructions (Addendum)
This is a sinus infection Start the Amoxicillin twice daily- take w/ food Drink plenty of fluids! REST! Add Delsym cough syrup as needed or Mucinex DM Call with any questions or concerns Hang in there!!!

## 2012-05-24 NOTE — Assessment & Plan Note (Signed)
New.  Start abx.  Reviewed supportive care and red flags that should prompt return.  Pt expressed understanding and is in agreement w/ plan.  

## 2012-05-24 NOTE — Progress Notes (Signed)
  Subjective:    Patient ID: Timothy Huynh, male    DOB: 08/28/1955, 57 y.o.   MRN: [id number]  HPI ? Sinus infxn- sxs started 8 days ago.  No fevers.  + facial pain/pressure.  + nasal congestion.  No ear pain.  + cough- productive.  + sick contacts.  No N/V/D.  Has tried sudafed, mucinex, alka-seltzer w/out relief.   Review of Systems For ROS see HPI     Objective:   Physical Exam  Vitals reviewed. Constitutional: He appears well-developed and well-nourished. No distress.  HENT:  Head: Normocephalic and atraumatic.  Right Ear: Tympanic membrane normal.  Left Ear: Tympanic membrane normal.  Nose: Mucosal edema and rhinorrhea present. Right sinus exhibits maxillary sinus tenderness. Right sinus exhibits no frontal sinus tenderness. Left sinus exhibits maxillary sinus tenderness. Left sinus exhibits no frontal sinus tenderness.  Mouth/Throat: Mucous membranes are normal. Oropharyngeal exudate and posterior oropharyngeal erythema present. No posterior oropharyngeal edema.       + PND  Eyes: Conjunctivae normal and EOM are normal. Pupils are equal, round, and reactive to light.  Neck: Normal range of motion. Neck supple.  Cardiovascular: Normal rate, regular rhythm and normal heart sounds.   Pulmonary/Chest: Effort normal and breath sounds normal. No respiratory distress. He has no wheezes.       + hacking cough  Lymphadenopathy:    He has no cervical adenopathy.  Skin: Skin is warm and dry.          Assessment & Plan:

## 2012-06-22 ENCOUNTER — Other Ambulatory Visit: Payer: Self-pay | Admitting: Family Medicine

## 2012-06-22 ENCOUNTER — Telehealth: Payer: Self-pay | Admitting: Family Medicine

## 2012-06-22 DIAGNOSIS — J329 Chronic sinusitis, unspecified: Secondary | ICD-10-CM

## 2012-06-22 MED ORDER — CEFUROXIME AXETIL 500 MG PO TABS: 500.0000 mg | ORAL_TABLET | Freq: Two times a day (BID) | ORAL | Status: AC

## 2012-06-22 NOTE — Telephone Encounter (Signed)
Patient Information:  Caller Name: Timothy Huynh  Phone: [phone]  Patient: Timothy Huynh, Timothy Huynh  Gender: Male  DOB: 09/09/1955  Age: 57 Years  PCP: Sheliah Hatch.  Office Follow Up:  Does the office need to follow up with this patient?: Yes  Instructions For The Office: Patient is requesting second round of antibiotics.  RN Note:  Patient states finished his course of antibiotics but sinuses infection has not resolved-was given amoxicillin and seen by Dr. Beverely Low on 05/24/12.  Is still blowing out green nasal discharge and coughing up some color as well.  Left ear hurting and glands are swollen.  Rates discomfort at 4-5/10 pain scale.  Denies fever or emergent symptoms.  Stated that Dr. Beverely Low stated to call back if not improved and she would call in something else.  Preferred drug store is [facility] on [address] in [place].  Symptoms  Reason For Call & Symptoms: 05/24/12  Reviewed Health History In EMR: Yes  Reviewed Medications In EMR: Yes  Reviewed Allergies In EMR: Yes  Reviewed Surgeries / Procedures: Yes  Date of Onset of Symptoms: 05/24/2012  Treatments Tried: started antibiotic  Treatments Tried Worked: Yes  Guideline(s) Used:  Sinus Pain and Congestion  Disposition Per Guideline:   Home Care  Reason For Disposition Reached:   Sinus congestion as part of a cold, present < 10 days  Advice Given:  For a Runny Nose With Profuse Discharge:  Nasal mucus and discharge helps to wash viruses and bacteria out of the nose and sinuses.  For a Stuffy Nose - Use Nasal Washes:  Introduction: Saline (salt water) nasal irrigation (nasal wash) is an effective and simple home remedy for treating stuffy nose and sinus congestion. The nose can be irrigated by pouring, spraying, or squirting salt water into the nose and then letting it run back out.  Call Back If:   Severe pain lasts longer than 2 hours after pain medicine  Sinus pain lasts longer than 1 day after starting treatment using nasal  washes  Sinus congestion (fullness) lasts longer than 10 days  Fever lasts longer than 3 days  You become worse.  RN Overrode Recommendation:  Patient Requests Prescription  Patient is requesting second round of antibiotics.

## 2012-06-22 NOTE — Telephone Encounter (Signed)
ceftin sent to pharmacy.

## 2012-06-22 NOTE — Telephone Encounter (Signed)
Patient aware Rx sent       [facility] 

## 2012-06-25 ENCOUNTER — Telehealth: Payer: Self-pay | Admitting: Family Medicine

## 2012-06-25 NOTE — Telephone Encounter (Signed)
refill amoxicillin 875mg  tablet #20 Take one tablet by outh 2 times daily, last fll 1.9.14

## 2012-07-05 ENCOUNTER — Telehealth: Payer: Self-pay | Admitting: Family Medicine

## 2012-07-05 MED ORDER — FENOFIBRATE 160 MG PO TABS: ORAL_TABLET | ORAL | Status: DC

## 2012-07-05 MED ORDER — ROSUVASTATIN CALCIUM 20 MG PO TABS: ORAL_TABLET | ORAL | Status: DC

## 2012-07-05 NOTE — Telephone Encounter (Signed)
NOTE NEW Pharmacy Needs refills x 2 1-Crestor tab 20MG  - no instructions or last fill date - requesting a 90-day supply  Last wrt [id number] 20 MG TAKE 1 TABLET (20 MG TOTAL ) BY MOUTH DAILY #90 wt/1-refill 8.4.13  2-Fenofibrate Tab 160MG  - no instructions or last fill date - requesting a 90-day supply  Last wrt as: Fenofibrate (Tab) 160 MG TAKE 1 TABLET (160 MG) BY MOUTH DAILY  #90 wt3-refill 7.1.13

## 2012-08-15 ENCOUNTER — Telehealth: Payer: Self-pay | Admitting: Family Medicine

## 2012-08-15 NOTE — Telephone Encounter (Signed)
Patient calling, states in response to a letter that he needs to call and schedule fasting labs.  I do not see any lab orders in the system.  What labs does he need?

## 2012-08-15 NOTE — Telephone Encounter (Signed)
He needs a CPE, please schedule

## 2012-08-15 NOTE — Telephone Encounter (Signed)
His last physical was April 2013 so he's actually due for cpe labs and cpe

## 2012-09-27 ENCOUNTER — Ambulatory Visit (INDEPENDENT_AMBULATORY_CARE_PROVIDER_SITE_OTHER): Payer: Medicare HMO | Admitting: Family Medicine

## 2012-09-27 ENCOUNTER — Encounter: Payer: Self-pay | Admitting: Family Medicine

## 2012-09-27 VITALS — BP 126/72 | HR 57 | Temp 98.6°F | Ht 68.0 in | Wt 183.6 lb

## 2012-09-27 DIAGNOSIS — E785 Hyperlipidemia, unspecified: Secondary | ICD-10-CM

## 2012-09-27 DIAGNOSIS — N529 Male erectile dysfunction, unspecified: Secondary | ICD-10-CM

## 2012-09-27 DIAGNOSIS — Z0000 Encounter for general adult medical examination without abnormal findings: Secondary | ICD-10-CM

## 2012-09-27 LAB — MICROALBUMIN / CREATININE URINE RATIO
Microalb Creat Ratio: 0.4 mg/g (ref 0.0–30.0)
Microalb, Ur: 0.7 mg/dL (ref 0.0–1.9)

## 2012-09-27 LAB — POCT URINALYSIS DIPSTICK
Bilirubin, UA: NEGATIVE
Blood, UA: NEGATIVE
Ketones, UA: NEGATIVE
Nitrite, UA: NEGATIVE
Protein, UA: NEGATIVE
pH, UA: 6.5

## 2012-09-27 LAB — CBC WITH DIFFERENTIAL/PLATELET
Basophils Relative: 0.4 % (ref 0.0–3.0)
Eosinophils Relative: 3.6 % (ref 0.0–5.0)
Lymphocytes Relative: 22.9 % (ref 12.0–46.0)
MCV: 91 fl (ref 78.0–100.0)
Monocytes Absolute: 0.5 10*3/uL (ref 0.1–1.0)
Monocytes Relative: 8.3 % (ref 3.0–12.0)
Neutrophils Relative %: 64.8 % (ref 43.0–77.0)
Platelets: 380 10*3/uL (ref 150.0–400.0)
RBC: 4.49 Mil/uL (ref 4.22–5.81)
WBC: 5.8 10*3/uL (ref 4.5–10.5)

## 2012-09-27 LAB — HEPATIC FUNCTION PANEL
ALT: 28 U/L (ref 0–53)
Alkaline Phosphatase: 48 U/L (ref 39–117)
Bilirubin, Direct: 0.1 mg/dL (ref 0.0–0.3)
Total Bilirubin: 0.7 mg/dL (ref 0.3–1.2)
Total Protein: 7.5 g/dL (ref 6.0–8.3)

## 2012-09-27 LAB — BASIC METABOLIC PANEL
BUN: 12 mg/dL (ref 6–23)
Calcium: 9.4 mg/dL (ref 8.4–10.5)
Creatinine, Ser: 1 mg/dL (ref 0.4–1.5)
GFR: 86.16 mL/min (ref >60.00)

## 2012-09-27 LAB — LIPID PANEL
LDL Cholesterol: 84 mg/dL (ref 0–99)
Total CHOL/HDL Ratio: 4
Triglycerides: 101 mg/dL (ref 0.0–149.0)

## 2012-09-27 LAB — TSH: TSH: 2.54 u[IU]/mL (ref 0.35–5.50)

## 2012-09-27 MED ORDER — TADALAFIL 10 MG PO TABS: 10.0000 mg | ORAL_TABLET | Freq: Every day | ORAL | Status: DC | PRN

## 2012-09-27 MED ORDER — SILDENAFIL CITRATE 50 MG PO TABS: 50.0000 mg | ORAL_TABLET | Freq: Every day | ORAL | Status: DC | PRN

## 2012-09-27 NOTE — Patient Instructions (Addendum)
Preventive Care for Adults, Male A healthy lifestyle and preventive care can promote health and wellness. Preventive health guidelines for men include the following key practices:  A routine yearly physical is a good way to check with your caregiver about your health and preventative screening. It is a chance to share any concerns and updates on your health, and to receive a thorough exam.  Visit your dentist for a routine exam and preventative care every 6 months. Brush your teeth twice a day and floss once a day. Good oral hygiene prevents tooth decay and gum disease.  The frequency of eye exams is based on your age, health, family medical history, use of contact lenses, and other factors. Follow your caregiver's recommendations for frequency of eye exams.  Eat a healthy diet. Foods like vegetables, fruits, whole grains, low-fat dairy products, and lean protein foods contain the nutrients you need without too many calories. Decrease your intake of foods high in solid fats, added sugars, and salt. Eat the right amount of calories for you.Get information about a proper diet from your caregiver, if necessary.  Regular physical exercise is one of the most important things you can do for your health. Most adults should get at least 150 minutes of moderate-intensity exercise (any activity that increases your heart rate and causes you to sweat) each week. In addition, most adults need muscle-strengthening exercises on 2 or more days a week.  Maintain a healthy weight. The body mass index (BMI) is a screening tool to identify possible weight problems. It provides an estimate of body fat based on height and weight. Your caregiver can help determine your BMI, and can help you achieve or maintain a healthy weight.For adults 20 years and older:  A BMI below 18.5 is considered underweight.  A BMI of 18.5 to 24.9 is normal.  A BMI of 25 to 29.9 is considered overweight.  A BMI of 30 and above is  considered obese.  Maintain normal blood lipids and cholesterol levels by exercising and minimizing your intake of saturated fat. Eat a balanced diet with plenty of fruit and vegetables. Blood tests for lipids and cholesterol should begin at age 20 and be repeated every 5 years. If your lipid or cholesterol levels are high, you are over 50, or you are a high risk for heart disease, you may need your cholesterol levels checked more frequently.Ongoing high lipid and cholesterol levels should be treated with medicines if diet and exercise are not effective.  If you smoke, find out from your caregiver how to quit. If you do not use tobacco, do not start.  If you choose to drink alcohol, do not exceed 2 drinks per day. One drink is considered to be 12 ounces (355 mL) of beer, 5 ounces (148 mL) of wine, or 1.5 ounces (44 mL) of liquor.  Avoid use of street drugs. Do not share needles with anyone. Ask for help if you need support or instructions about stopping the use of drugs.  High blood pressure causes heart disease and increases the risk of stroke. Your blood pressure should be checked at least every 1 to 2 years. Ongoing high blood pressure should be treated with medicines, if weight loss and exercise are not effective.  If you are 45 to 56 years old, ask your caregiver if you should take aspirin to prevent heart disease.  Diabetes screening involves taking a blood sample to check your fasting blood sugar level. This should be done once every 3 years,   after age 45, if you are within normal weight and without risk factors for diabetes. Testing should be considered at a younger age or be carried out more frequently if you are overweight and have at least 1 risk factor for diabetes.  Colorectal cancer can be detected and often prevented. Most routine colorectal cancer screening begins at the age of 50 and continues through age 75. However, your caregiver may recommend screening at an earlier age if you  have risk factors for colon cancer. On a yearly basis, your caregiver may provide home test kits to check for hidden blood in the stool. Use of a small camera at the end of a tube, to directly examine the colon (sigmoidoscopy or colonoscopy), can detect the earliest forms of colorectal cancer. Talk to your caregiver about this at age 50, when routine screening begins. Direct examination of the colon should be repeated every 5 to 10 years through age 75, unless early forms of pre-cancerous polyps or small growths are found.  Hepatitis C blood testing is recommended for all people born from 1945 through 1965 and any individual with known risks for hepatitis C.  Practice safe sex. Use condoms and avoid high-risk sexual practices to reduce the spread of sexually transmitted infections (STIs). STIs include gonorrhea, chlamydia, syphilis, trichomonas, herpes, HPV, and human immunodeficiency virus (HIV). Herpes, HIV, and HPV are viral illnesses that have no cure. They can result in disability, cancer, and death.  A one-time screening for abdominal aortic aneurysm (AAA) and surgical repair of large AAAs by sound wave imaging (ultrasonography) is recommended for ages 65 to 75 years who are current or former smokers.  Healthy men should no longer receive prostate-specific antigen (PSA) blood tests as part of routine cancer screening. Consult with your caregiver about prostate cancer screening.  Testicular cancer screening is not recommended for adult males who have no symptoms. Screening includes self-exam, caregiver exam, and other screening tests. Consult with your caregiver about any symptoms you have or any concerns you have about testicular cancer.  Use sunscreen with skin protection factor (SPF) of 30 or more. Apply sunscreen liberally and repeatedly throughout the day. You should seek shade when your shadow is shorter than you. Protect yourself by wearing long sleeves, pants, a wide-brimmed hat, and  sunglasses year round, whenever you are outdoors.  Once a month, do a whole body skin exam, using a mirror to look at the skin on your back. Notify your caregiver of new moles, moles that have irregular borders, moles that are larger than a pencil eraser, or moles that have changed in shape or color.  Stay current with required immunizations.  Influenza. You need a dose every fall (or winter). The composition of the flu vaccine changes each year, so being vaccinated once is not enough.  Pneumococcal polysaccharide. You need 1 to 2 doses if you smoke cigarettes or if you have certain chronic medical conditions. You need 1 dose at age 65 (or older) if you have never been vaccinated.  Tetanus, diphtheria, pertussis (Tdap, Td). Get 1 dose of Tdap vaccine if you are younger than age 65 years, are over 65 and have contact with an infant, are a healthcare worker, or simply want to be protected from whooping cough. After that, you need a Td booster dose every 10 years. Consult your caregiver if you have not had at least 3 tetanus and diphtheria-containing shots sometime in your life or have a deep or dirty wound.  HPV. This vaccine is recommended   for males 13 through 56 years of age. This vaccine may be given to men 22 through 56 years of age who have not completed the 3 dose series. It is recommended for men through age 26 who have sex with men or whose immune system is weakened because of HIV infection, other illness, or medications. The vaccine is given in 3 doses over 6 months.  Measles, mumps, rubella (MMR). You need at least 1 dose of MMR if you were born in 1957 or later. You may also need a 2nd dose.  Meningococcal. If you are age 19 to 21 years and a first-year college student living in a residence hall, or have one of several medical conditions, you need to get vaccinated against meningococcal disease. You may also need additional booster doses.  Zoster (shingles). If you are age 60 years or  older, you should get this vaccine.  Varicella (chickenpox). If you have never had chickenpox or you were vaccinated but received only 1 dose, talk to your caregiver to find out if you need this vaccine.  Hepatitis A. You need this vaccine if you have a specific risk factor for hepatitis A virus infection, or you simply wish to be protected from this disease. The vaccine is usually given as 2 doses, 6 to 18 months apart.  Hepatitis B. You need this vaccine if you have a specific risk factor for hepatitis B virus infection or you simply wish to be protected from this disease. The vaccine is given in 3 doses, usually over 6 months. Preventative Service / Frequency Ages 19 to 39  Blood pressure check.** / Every 1 to 2 years.  Lipid and cholesterol check.** / Every 5 years beginning at age 20.  Hepatitis C blood test.** / For any individual with known risks for hepatitis C.  Skin self-exam. / Monthly.  Influenza immunization.** / Every year.  Pneumococcal polysaccharide immunization.** / 1 to 2 doses if you smoke cigarettes or if you have certain chronic medical conditions.  Tetanus, diphtheria, pertussis (Tdap,Td) immunization. / A one-time dose of Tdap vaccine. After that, you need a Td booster dose every 10 years.  HPV immunization. / 3 doses over 6 months, if 26 and younger.  Measles, mumps, rubella (MMR) immunization. / You need at least 1 dose of MMR if you were born in 1957 or later. You may also need a 2nd dose.  Meningococcal immunization. / 1 dose if you are age 19 to 21 years and a first-year college student living in a residence hall, or have one of several medical conditions, you need to get vaccinated against meningococcal disease. You may also need additional booster doses.  Varicella immunization.** / Consult your caregiver.  Hepatitis A immunization.** / Consult your caregiver. 2 doses, 6 to 18 months apart.  Hepatitis B immunization.** / Consult your caregiver. 3 doses  usually over 6 months. Ages 40 to 64  Blood pressure check.** / Every 1 to 2 years.  Lipid and cholesterol check.** / Every 5 years beginning at age 20.  Fecal occult blood test (FOBT) of stool. / Every year beginning at age 50 and continuing until age 75. You may not have to do this test if you get colonoscopy every 10 years.  Flexible sigmoidoscopy** or colonoscopy.** / Every 5 years for a flexible sigmoidoscopy or every 10 years for a colonoscopy beginning at age 50 and continuing until age 75.  Hepatitis C blood test.** / For all people born from 1945 through 1965 and any   individual with known risks for hepatitis C.  Skin self-exam. / Monthly.  Influenza immunization.** / Every year.  Pneumococcal polysaccharide immunization.** / 1 to 2 doses if you smoke cigarettes or if you have certain chronic medical conditions.  Tetanus, diphtheria, pertussis (Tdap/Td) immunization.** / A one-time dose of Tdap vaccine. After that, you need a Td booster dose every 10 years.  Measles, mumps, rubella (MMR) immunization. / You need at least 1 dose of MMR if you were born in 1957 or later. You may also need a 2nd dose.  Varicella immunization.**/ Consult your caregiver.  Meningococcal immunization.** / Consult your caregiver.  Hepatitis A immunization.** / Consult your caregiver. 2 doses, 6 to 18 months apart.  Hepatitis B immunization.** / Consult your caregiver. 3 doses, usually over 6 months. Ages 65 and over  Blood pressure check.** / Every 1 to 2 years.  Lipid and cholesterol check.**/ Every 5 years beginning at age 20.  Fecal occult blood test (FOBT) of stool. / Every year beginning at age 50 and continuing until age 75. You may not have to do this test if you get colonoscopy every 10 years.  Flexible sigmoidoscopy** or colonoscopy.** / Every 5 years for a flexible sigmoidoscopy or every 10 years for a colonoscopy beginning at age 50 and continuing until age 75.  Hepatitis C blood  test.** / For all people born from 1945 through 1965 and any individual with known risks for hepatitis C.  Abdominal aortic aneurysm (AAA) screening.** / A one-time screening for ages 65 to 75 years who are current or former smokers.  Skin self-exam. / Monthly.  Influenza immunization.** / Every year.  Pneumococcal polysaccharide immunization.** / 1 dose at age 65 (or older) if you have never been vaccinated.  Tetanus, diphtheria, pertussis (Tdap, Td) immunization. / A one-time dose of Tdap vaccine if you are over 65 and have contact with an infant, are a healthcare worker, or simply want to be protected from whooping cough. After that, you need a Td booster dose every 10 years.  Varicella immunization. ** / Consult your caregiver.  Meningococcal immunization.** / Consult your caregiver.  Hepatitis A immunization. ** / Consult your caregiver. 2 doses, 6 to 18 months apart.  Hepatitis B immunization.** / Check with your caregiver. 3 doses, usually over 6 months. **Family history and personal history of risk and conditions may change your caregiver's recommendations. Document Released: 06/28/2001 Document Revised: 07/25/2011 Document Reviewed: 09/27/2010 Exit[employer] Patient Information 2013 [employer].  

## 2012-09-27 NOTE — Progress Notes (Signed)
Subjective:    Patient ID: Timothy Huynh, male    DOB: Apr 17, 1956, 57 y.o.   MRN: [id number]  HPI Pt here for cpe and labs.  Pt had some issues with his knee and low back but they are much better.  Pt sees ortho.     Review of Systems Review of Systems  Constitutional: Negative for activity change, appetite change and fatigue.  HENT: Negative for hearing loss, congestion, tinnitus and ear discharge.  dentist q62m Eyes: Negative for visual disturbance (see optho q1y -- vision corrected to 20/20 with glasses).  Respiratory: Negative for cough, chest tightness and shortness of breath.   Cardiovascular: Negative for chest pain, palpitations and leg swelling.  Gastrointestinal: Negative for abdominal pain, diarrhea, constipation and abdominal distention.  Genitourinary: Negative for urgency, frequency, decreased urine volume and difficulty urinating.  Musculoskeletal: Negative for back pain, arthralgias and gait problem.  Skin: Negative for color change, pallor and rash.  Neurological: Negative for dizziness, light-headedness, numbness and headaches.  Hematological: Negative for adenopathy. Does not bruise/bleed easily.  Psychiatric/Behavioral: Negative for suicidal ideas, confusion, sleep disturbance, self-injury, dysphoric mood, decreased concentration and agitation.   Past Medical History  Diagnosis Date  . Hypertension   . Hyperlipidemia    History   Social History  . Marital Status: Married    Spouse Name: N/A    Number of Children: N/A  . Years of Education: N/A   Occupational History  . [employer]    Social History Main Topics  . Smoking status: Never Smoker   . Smokeless tobacco: Never Used  . Alcohol Use: Yes  . Drug Use: No  . Sexually Active: Yes -- Male partner(s)   Other Topics Concern  . Not on file   Social History Narrative   Exercise--- 2-3 x a week   Family History  Problem Relation Age of Onset  . Cancer Father     Oral --angioplasty expired  from cancer  . Diabetes Father   . Dementia Mother    Current outpatient prescriptions:fenofibrate 160 MG tablet, TAKE 1 TABLET (160 MG) BY MOUTH DAILY, Disp: 90 tablet, Rfl: 0;  rosuvastatin (CRESTOR) 20 MG tablet, TAKE 1 TABLET (20 MG TOTAL ) BY MOUTH DAILY, Disp: 90 tablet, Rfl: 0;  sildenafil (VIAGRA) 50 MG tablet, Take 1 tablet (50 mg total) by mouth daily as needed for erectile dysfunction., Disp: 10 tablet, Rfl: 2 tadalafil (CIALIS) 10 MG tablet, Take 1 tablet (10 mg total) by mouth daily as needed for erectile dysfunction., Disp: 4 tablet, Rfl: 2       Objective:   Physical Exam BP 126/72  Pulse 57  Temp(Src) 98.6 F (37 C) (Oral)  Ht 5\' 8"  (1.727 m)  Wt 183 lb 9.6 oz (83.28 kg)  BMI 27.92 kg/m2  SpO2 98% General appearance: alert, cooperative, appears stated age and no distress Head: Normocephalic, without obvious abnormality, atraumatic Eyes: conjunctivae/corneas clear. PERRL, EOM's intact. Fundi benign. Ears: normal TM's and external ear canals both ears Nose: Nares normal. Septum midline. Mucosa normal. No drainage or sinus tenderness. Throat: lips, mucosa, and tongue normal; teeth and gums normal Neck: no adenopathy, no carotid bruit, no JVD, supple, symmetrical, trachea midline and thyroid not enlarged, symmetric, no tenderness/mass/nodules Back: symmetric, no curvature. ROM normal. No CVA tenderness. Lungs: clear to auscultation bilaterally Chest wall: no tenderness Heart: regular rate and rhythm, S1, S2 normal, no murmur, click, rub or gallop Abdomen: soft, non-tender; bowel sounds normal; no masses,  no organomegaly Male genitalia: penis: no  lesions or discharge. testes: no masses or tenderness. no hernias Rectal: normal tone, normal prostate, no masses or tenderness and soft brown guaiac negative stool noted Extremities: extremities normal, atraumatic, no cyanosis or edema Pulses: 2+ and symmetric Skin: Skin color, texture, turgor normal. No rashes or  lesions Lymph nodes: Cervical, supraclavicular, and axillary nodes normal. Neurologic: Alert and oriented X 3, normal strength and tone. Normal symmetric reflexes. Normal coordination and gait Psych-- no depression, no anxiety        Assessment & Plan:

## 2012-09-27 NOTE — Assessment & Plan Note (Signed)
con't meds  Check labs 

## 2012-09-28 LAB — TESTOSTERONE, FREE, TOTAL, SHBG
Sex Hormone Binding: 33 nmol/L (ref 13–71)
Testosterone: 296 ng/dL — ABNORMAL LOW (ref 300–890)

## 2012-10-31 ENCOUNTER — Other Ambulatory Visit: Payer: Self-pay | Admitting: Family Medicine

## 2013-02-11 ENCOUNTER — Ambulatory Visit (INDEPENDENT_AMBULATORY_CARE_PROVIDER_SITE_OTHER): Payer: Medicare HMO | Admitting: Family Medicine

## 2013-02-11 ENCOUNTER — Encounter: Payer: Self-pay | Admitting: Family Medicine

## 2013-02-11 VITALS — BP 120/82 | HR 58 | Temp 98.4°F | Wt 187.6 lb

## 2013-02-11 DIAGNOSIS — J329 Chronic sinusitis, unspecified: Secondary | ICD-10-CM

## 2013-02-11 DIAGNOSIS — M2550 Pain in unspecified joint: Secondary | ICD-10-CM

## 2013-02-11 LAB — CBC WITH DIFFERENTIAL/PLATELET
Basophils Absolute: 0 10*3/uL (ref 0.0–0.1)
Basophils Relative: 0 % (ref 0.0–3.0)
Eosinophils Absolute: 0.3 10*3/uL (ref 0.0–0.7)
Hemoglobin: 12.9 g/dL — ABNORMAL LOW (ref 13.0–17.0)
Lymphocytes Relative: 10.7 % — ABNORMAL LOW (ref 12.0–46.0)
Lymphs Abs: 1 10*3/uL (ref 0.7–4.0)
MCHC: 34.2 g/dL (ref 30.0–36.0)
MCV: 88.8 fl (ref 78.0–100.0)
Monocytes Absolute: 0.7 10*3/uL (ref 0.1–1.0)
Neutrophils Relative %: 79.3 % — ABNORMAL HIGH (ref 43.0–77.0)
Platelets: 375 10*3/uL (ref 150.0–400.0)
RBC: 4.24 Mil/uL (ref 4.22–5.81)
RDW: 12.8 % (ref 11.5–14.6)
WBC: 9.3 10*3/uL (ref 4.5–10.5)

## 2013-02-11 LAB — HEPATIC FUNCTION PANEL
ALT: 18 U/L (ref 0–53)
Albumin: 4.5 g/dL (ref 3.5–5.2)
Bilirubin, Direct: 0 mg/dL (ref 0.0–0.3)
Total Bilirubin: 0.6 mg/dL (ref 0.3–1.2)
Total Protein: 7.6 g/dL (ref 6.0–8.3)

## 2013-02-11 LAB — BASIC METABOLIC PANEL
BUN: 11 mg/dL (ref 6–23)
CO2: 27 mEq/L (ref 19–32)
Glucose, Bld: 87 mg/dL (ref 70–99)
Potassium: 4.3 mEq/L (ref 3.5–5.1)
Sodium: 135 mEq/L (ref 135–145)

## 2013-02-11 LAB — SEDIMENTATION RATE: Sed Rate: 23 mm/hr — ABNORMAL HIGH (ref 0–22)

## 2013-02-11 MED ORDER — CEFUROXIME AXETIL 500 MG PO TABS: 500.0000 mg | ORAL_TABLET | Freq: Two times a day (BID) | ORAL | Status: AC

## 2013-02-11 NOTE — Patient Instructions (Signed)

## 2013-02-11 NOTE — Progress Notes (Signed)
  Subjective:     Timothy Huynh is a 57 y.o. male who presents for evaluation of symptoms of a URI. Symptoms include congestion, nasal congestion, post nasal drip and sore throat. Onset of symptoms was 3 days ago, and has been gradually worsening since that time. Treatment to date: cough suppressants.  The following portions of the patient's history were reviewed and updated as appropriate: allergies, current medications, past family history, past medical history, past social history, past surgical history and problem list.  Review of Systems Pertinent items are noted in HPI.   Objective:    BP 120/82  Pulse 58  Temp(Src) 98.4 F (36.9 C) (Oral)  Wt 187 lb 9.6 oz (85.095 kg)  BMI 28.53 kg/m2  SpO2 98% General appearance: alert, cooperative, appears stated age and no distress Ears: normal TM's and external ear canals both ears Nose: green and yellow discharge, moderate congestion, turbinates red, swollen, sinus tenderness bilateral Throat: abnormal findings: mild oropharyngeal erythema and pnd Neck: moderate anterior cervical adenopathy, supple, symmetrical, trachea midline and thyroid not enlarged, symmetric, no tenderness/mass/nodules Lungs: clear to auscultation bilaterally Heart: S1, S2 normal Extremities: edema L wrist and thenar emminence    Assessment:    sinusitis   Plan:    Discussed the diagnosis and treatment of sinusitis. Suggested symptomatic OTC remedies. Nasal saline spray for congestion. Ceftin per orders. Nasal steroids per orders. Follow up as needed.

## 2013-02-11 NOTE — Assessment & Plan Note (Signed)
With myalgias Hold crestor Check labs

## 2013-02-12 LAB — ANA: Anti Nuclear Antibody(ANA): NEGATIVE

## 2013-02-14 LAB — VITAMIN D 1,25 DIHYDROXY
Vitamin D 1, 25 (OH)2 Total: 70 pg/mL (ref 18–72)
Vitamin D2 1, 25 (OH)2: <8 pg/mL
Vitamin D3 1, 25 (OH)2: 70 pg/mL

## 2013-10-09 ENCOUNTER — Telehealth: Payer: Self-pay

## 2013-10-09 NOTE — Telephone Encounter (Signed)
Medication List and allergies:  Reviewed and Updated  90 day supply/mail order:  [place], [place] prescriptions:   [id number] - [place], [place]  Immunization due:  UTD  A/P: Personal, family and PSH: Reviewed and Updated Flu- 02/13/2013 Tdap- 11/24/09 [facility]- 08/07/07-Dr. Kaplan--normal--next [facility]-07/2017 PSA- 09/27/12--1.04   To discuss with provider: Nothing at this time.

## 2013-10-10 ENCOUNTER — Encounter: Payer: Self-pay | Admitting: Family Medicine

## 2013-10-10 ENCOUNTER — Ambulatory Visit (INDEPENDENT_AMBULATORY_CARE_PROVIDER_SITE_OTHER): Payer: BC Managed Care – PPO | Admitting: Family Medicine

## 2013-10-10 VITALS — BP 134/90 | HR 54 | Temp 98.5°F | Ht 68.0 in | Wt 167.4 lb

## 2013-10-10 DIAGNOSIS — N529 Male erectile dysfunction, unspecified: Secondary | ICD-10-CM

## 2013-10-10 DIAGNOSIS — E785 Hyperlipidemia, unspecified: Secondary | ICD-10-CM

## 2013-10-10 DIAGNOSIS — Z0000 Encounter for general adult medical examination without abnormal findings: Secondary | ICD-10-CM

## 2013-10-10 LAB — CBC WITH DIFFERENTIAL/PLATELET
BASOS PCT: 0.3 % (ref 0.0–3.0)
Basophils Absolute: 0 10*3/uL (ref 0.0–0.1)
Eosinophils Absolute: 0.1 10*3/uL (ref 0.0–0.7)
Eosinophils Relative: 2.9 % (ref 0.0–5.0)
HEMATOCRIT: 40.5 % (ref 39.0–52.0)
Hemoglobin: 13.9 g/dL (ref 13.0–17.0)
LYMPHS ABS: 1.1 10*3/uL (ref 0.7–4.0)
Lymphocytes Relative: 24.3 % (ref 12.0–46.0)
MCHC: 34.3 g/dL (ref 30.0–36.0)
MCV: 92.1 fl (ref 78.0–100.0)
MONO ABS: 0.4 10*3/uL (ref 0.1–1.0)
Monocytes Relative: 8.7 % (ref 3.0–12.0)
Neutro Abs: 3 10*3/uL (ref 1.4–7.7)
Neutrophils Relative %: 63.8 % (ref 43.0–77.0)
Platelets: 322 10*3/uL (ref 150.0–400.0)
RBC: 4.39 Mil/uL (ref 4.22–5.81)
RDW: 13.6 % (ref 11.5–15.5)
WBC: 4.7 10*3/uL (ref 4.0–10.5)

## 2013-10-10 LAB — LIPID PANEL
CHOLESTEROL: 180 mg/dL (ref 0–200)
HDL: 44.6 mg/dL (ref >39.00)
LDL Cholesterol: 121 mg/dL — ABNORMAL HIGH (ref 0–99)
TRIGLYCERIDES: 71 mg/dL (ref 0.0–149.0)
Total CHOL/HDL Ratio: 4
VLDL: 14.2 mg/dL (ref 0.0–40.0)

## 2013-10-10 LAB — BASIC METABOLIC PANEL
BUN: 13 mg/dL (ref 6–23)
CHLORIDE: 102 meq/L (ref 96–112)
CO2: 28 meq/L (ref 19–32)
Calcium: 9.4 mg/dL (ref 8.4–10.5)
Creatinine, Ser: 0.8 mg/dL (ref 0.4–1.5)
GFR: 100.15 mL/min (ref >60.00)
Glucose, Bld: 88 mg/dL (ref 70–99)
POTASSIUM: 4.3 meq/L (ref 3.5–5.1)
Sodium: 138 mEq/L (ref 135–145)

## 2013-10-10 LAB — HEPATIC FUNCTION PANEL
ALBUMIN: 4.4 g/dL (ref 3.5–5.2)
ALT: 24 U/L (ref 0–53)
AST: 13 U/L (ref 0–37)
Alkaline Phosphatase: 60 U/L (ref 39–117)
Bilirubin, Direct: 0.1 mg/dL (ref 0.0–0.3)
TOTAL PROTEIN: 6.8 g/dL (ref 6.0–8.3)
Total Bilirubin: 1 mg/dL (ref 0.2–1.2)

## 2013-10-10 LAB — TSH: TSH: 2.93 u[IU]/mL (ref 0.35–4.50)

## 2013-10-10 LAB — PSA: PSA: 1.95 ng/mL (ref 0.10–4.00)

## 2013-10-10 MED ORDER — SILDENAFIL CITRATE 50 MG PO TABS: 50.0000 mg | ORAL_TABLET | Freq: Every day | ORAL | Status: DC | PRN

## 2013-10-10 NOTE — Patient Instructions (Signed)
Preventive Care for Adults, Male A healthy lifestyle and preventive care can promote health and wellness. Preventive health guidelines for men include the following key practices:  A routine yearly physical is a good way to check with your health care provider about your health and preventative screening. It is a chance to share any concerns and updates on your health and to receive a thorough exam.  Visit your dentist for a routine exam and preventative care every 6 months. Brush your teeth twice a day and floss once a day. Good oral hygiene prevents tooth decay and gum disease.  The frequency of eye exams is based on your age, health, family medical history, use of contact lenses, and other factors. Follow your health care provider's recommendations for frequency of eye exams.  Eat a healthy diet. Foods such as vegetables, fruits, whole grains, low-fat dairy products, and lean protein foods contain the nutrients you need without too many calories. Decrease your intake of foods high in solid fats, added sugars, and salt. Eat the right amount of calories for you.Get information about a proper diet from your health care provider, if necessary.  Regular physical exercise is one of the most important things you can do for your health. Most adults should get at least 150 minutes of moderate-intensity exercise (any activity that increases your heart rate and causes you to sweat) each week. In addition, most adults need muscle-strengthening exercises on 2 or more days a week.  Maintain a healthy weight. The body mass index (BMI) is a screening tool to identify possible weight problems. It provides an estimate of body fat based on height and weight. Your health care provider can find your BMI and can help you achieve or maintain a healthy weight.For adults 20 years and older:  A BMI below 18.5 is considered underweight.  A BMI of 18.5 to 24.9 is normal.  A BMI of 25 to 29.9 is considered  overweight.  A BMI of 30 and above is considered obese.  Maintain normal blood lipids and cholesterol levels by exercising and minimizing your intake of saturated fat. Eat a balanced diet with plenty of fruit and vegetables. Blood tests for lipids and cholesterol should begin at age 42 and be repeated every 5 years. If your lipid or cholesterol levels are high, you are over 50, or you are at high risk for heart disease, you may need your cholesterol levels checked more frequently.Ongoing high lipid and cholesterol levels should be treated with medicines if diet and exercise are not working.  If you smoke, find out from your health care provider how to quit. If you do not use tobacco, do not start.  Lung cancer screening is recommended for adults aged 24 80 years who are at high risk for developing lung cancer because of a history of smoking. A yearly low-dose CT scan of the lungs is recommended for people who have at least a 30-pack-year history of smoking and are a current smoker or have quit within the past 15 years. A pack year of smoking is smoking an average of 1 pack of cigarettes a day for 1 year (for example: 1 pack a day for 30 years or 2 packs a day for 15 years). Yearly screening should continue until the smoker has stopped smoking for at least 15 years. Yearly screening should be stopped for people who develop a health problem that would prevent them from having lung cancer treatment.  If you choose to drink alcohol, do not have  more than 2 drinks per day. One drink is considered to be 12 ounces (355 mL) of beer, 5 ounces (148 mL) of wine, or 1.5 ounces (44 mL) of liquor.  Avoid use of street drugs. Do not share needles with anyone. Ask for help if you need support or instructions about stopping the use of drugs.  High blood pressure causes heart disease and increases the risk of stroke. Your blood pressure should be checked at least every 1 2 years. Ongoing high blood pressure should be  treated with medicines, if weight loss and exercise are not effective.  If you are 75 57 years old, ask your health care provider if you should take aspirin to prevent heart disease.  Diabetes screening involves taking a blood sample to check your fasting blood sugar level. This should be done once every 3 years, after age 19, if you are within normal weight and without risk factors for diabetes. Testing should be considered at a younger age or be carried out more frequently if you are overweight and have at least 1 risk factor for diabetes.  Colorectal cancer can be detected and often prevented. Most routine colorectal cancer screening begins at the age of 47 and continues through age 80. However, your health care provider may recommend screening at an earlier age if you have risk factors for colon cancer. On a yearly basis, your health care provider may provide home test kits to check for hidden blood in the stool. Use of a small camera at the end of a tube to directly examine the colon (sigmoidoscopy or colonoscopy) can detect the earliest forms of colorectal cancer. Talk to your health care provider about this at age 66, when routine screening begins. Direct exam of the colon should be repeated every 5 10 years through age 19, unless early forms of precancerous polyps or small growths are found.  People who are at an increased risk for hepatitis B should be screened for this virus. You are considered at high risk for hepatitis B if:  You were born in a country where hepatitis B occurs often. Talk with your health care provider about which countries are considered high-risk.  Your parents were born in a high-risk country and you have not received a shot to protect against hepatitis B (hepatitis B vaccine).  You have HIV or AIDS.  You use needles to inject street drugs.  You live with, or have sex with, someone who has hepatitis B.  You are a man who has sex with other men (MSM).  You get  hemodialysis treatment.  You take certain medicines for conditions such as cancer, organ transplantation, and autoimmune conditions.  Hepatitis C blood testing is recommended for all people born from [birth year] through 1965 and any individual with known risks for hepatitis C.  Practice safe sex. Use condoms and avoid high-risk sexual practices to reduce the spread of sexually transmitted infections (STIs). STIs include gonorrhea, chlamydia, syphilis, trichomonas, herpes, HPV, and human immunodeficiency virus (HIV). Herpes, HIV, and HPV are viral illnesses that have no cure. They can result in disability, cancer, and death.  A one-time screening for abdominal aortic aneurysm (AAA) and surgical repair of large AAAs by ultrasound are recommended for men ages 94 to 74 years who are current or former smokers.  Healthy men should no longer receive prostate-specific antigen (PSA) blood tests as part of routine cancer screening. Talk with your health care provider about prostate cancer screening.  Testicular cancer screening is not recommended  for adult males who have no symptoms. Screening includes self-exam, a health care provider exam, and other screening tests. Consult with your health care provider about any symptoms you have or any concerns you have about testicular cancer.  Use sunscreen. Apply sunscreen liberally and repeatedly throughout the day. You should seek shade when your shadow is shorter than you. Protect yourself by wearing long sleeves, pants, a wide-brimmed hat, and sunglasses year round, whenever you are outdoors.  Once a month, do a whole-body skin exam, using a mirror to look at the skin on your back. Tell your health care provider about new moles, moles that have irregular borders, moles that are larger than a pencil eraser, or moles that have changed in shape or color.  Stay current with required vaccines (immunizations).  Influenza vaccine. All adults should be immunized every  year.  Tetanus, diphtheria, and acellular pertussis (Td, Tdap) vaccine. An adult who has not previously received Tdap or who does not know his vaccine status should receive 1 dose of Tdap. This initial dose should be followed by tetanus and diphtheria toxoids (Td) booster doses every 10 years. Adults with an unknown or incomplete history of completing a 3-dose immunization series with Td-containing vaccines should begin or complete a primary immunization series including a Tdap dose. Adults should receive a Td booster every 10 years.  Varicella vaccine. An adult without evidence of immunity to varicella should receive 2 doses or a second dose if he has previously received 1 dose.  Human papillomavirus (HPV) vaccine. Males aged 44 21 years who have not received the vaccine previously should receive the 3-dose series. Males aged 43 26 years may be immunized. Immunization is recommended through the age of 50 years for any male who has sex with males and did not get any or all doses earlier. Immunization is recommended for any person with an immunocompromised condition through the age of 23 years if he did not get any or all doses earlier. During the 3-dose series, the second dose should be obtained 4 8 weeks after the first dose. The third dose should be obtained 24 weeks after the first dose and 16 weeks after the second dose.  Zoster vaccine. One dose is recommended for adults aged 96 years or older unless certain conditions are present.  Measles, mumps, and rubella (MMR) vaccine. Adults born before 55 generally are considered immune to measles and mumps. Adults born in [birth year] or later should have 1 or more doses of MMR vaccine unless there is a contraindication to the vaccine or there is laboratory evidence of immunity to each of the three diseases. A routine second dose of MMR vaccine should be obtained at least 28 days after the first dose for students attending postsecondary schools, health care  workers, or international travelers. People who received inactivated measles vaccine or an unknown type of measles vaccine during 1963 1967 should receive 2 doses of MMR vaccine. People who received inactivated mumps vaccine or an unknown type of mumps vaccine before 1979 and are at high risk for mumps infection should consider immunization with 2 doses of MMR vaccine. Unvaccinated health care workers born before [birth year] who lack laboratory evidence of measles, mumps, or rubella immunity or laboratory confirmation of disease should consider measles and mumps immunization with 2 doses of MMR vaccine or rubella immunization with 1 dose of MMR vaccine.  Pneumococcal 13-valent conjugate (PCV13) vaccine. When indicated, a person who is uncertain of his immunization history and has no record of immunization  should receive the PCV13 vaccine. An adult aged 67 years or older who has certain medical conditions and has not been previously immunized should receive 1 dose of PCV13 vaccine. This PCV13 should be followed with a dose of pneumococcal polysaccharide (PPSV23) vaccine. The PPSV23 vaccine dose should be obtained at least 8 weeks after the dose of PCV13 vaccine. An adult aged 79 years or older who has certain medical conditions and previously received 1 or more doses of PPSV23 vaccine should receive 1 dose of PCV13. The PCV13 vaccine dose should be obtained 1 or more years after the last PPSV23 vaccine dose.  Pneumococcal polysaccharide (PPSV23) vaccine. When PCV13 is also indicated, PCV13 should be obtained first. All adults aged 74 years and older should be immunized. An adult younger than age 50 years who has certain medical conditions should be immunized. Any person who resides in a nursing home or long-term care facility should be immunized. An adult smoker should be immunized. People with an immunocompromised condition and certain other conditions should receive both PCV13 and PPSV23 vaccines. People with human  immunodeficiency virus (HIV) infection should be immunized as soon as possible after diagnosis. Immunization during chemotherapy or radiation therapy should be avoided. Routine use of PPSV23 vaccine is not recommended for American Indians, [place] Natives, or people younger than 65 years unless there are medical conditions that require PPSV23 vaccine. When indicated, people who have unknown immunization and have no record of immunization should receive PPSV23 vaccine. One-time revaccination 5 years after the first dose of PPSV23 is recommended for people aged 41 64 years who have chronic kidney failure, nephrotic syndrome, asplenia, or immunocompromised conditions. People who received 1 2 doses of PPSV23 before age 15 years should receive another dose of PPSV23 vaccine at age 48 years or later if at least 5 years have passed since the previous dose. Doses of PPSV23 are not needed for people immunized with PPSV23 at or after age 69 years.  Meningococcal vaccine. Adults with asplenia or persistent complement component deficiencies should receive 2 doses of quadrivalent meningococcal conjugate (MenACWY-D) vaccine. The doses should be obtained at least 2 months apart. Microbiologists working with certain meningococcal bacteria, [place] recruits, people at risk during an outbreak, and people who travel to or live in countries with a high rate of meningitis should be immunized. A first-year college student up through age 7 years who is living in a residence hall should receive a dose if he did not receive a dose on or after his 16th birthday. Adults who have certain high-risk conditions should receive one or more doses of vaccine.  Hepatitis A vaccine. Adults who wish to be protected from this disease, have certain high-risk conditions, work with hepatitis A-infected animals, work in hepatitis A research labs, or travel to or work in countries with a high rate of hepatitis A should be immunized. Adults who were  previously unvaccinated and who anticipate close contact with an international adoptee during the first 60 days after arrival in the [place] from a country with a high rate of hepatitis A should be immunized.  Hepatitis B vaccine. Adults who wish to be protected from this disease, have certain high-risk conditions, may be exposed to blood or other infectious body fluids, are household contacts or sex partners of hepatitis B positive people, are clients or workers in certain care facilities, or travel to or work in countries with a high rate of hepatitis B should be immunized.  Haemophilus influenzae type b (Hib) vaccine. A  previously unvaccinated person with asplenia or sickle cell disease or having a scheduled splenectomy should receive 1 dose of Hib vaccine. Regardless of previous immunization, a recipient of a hematopoietic stem cell transplant should receive a 3-dose series 6 12 months after his successful transplant. Hib vaccine is not recommended for adults with HIV infection. Preventive Service / Frequency Ages 62 to 3  Blood pressure check.** / Every 1 to 2 years.  Lipid and cholesterol check.** / Every 5 years beginning at age 43.  Hepatitis C blood test.** / For any individual with known risks for hepatitis C.  Skin self-exam. / Monthly.  Influenza vaccine. / Every year.  Tetanus, diphtheria, and acellular pertussis (Tdap, Td) vaccine.** / Consult your health care provider. 1 dose of Td every 10 years.  Varicella vaccine.** / Consult your health care provider.  HPV vaccine. / 3 doses over 6 months, if 48 or younger.  Measles, mumps, rubella (MMR) vaccine.** / You need at least 1 dose of MMR if you were born in 1957 or later. You may also need a second dose.  Pneumococcal 13-valent conjugate (PCV13) vaccine.** / Consult your health care provider.  Pneumococcal polysaccharide (PPSV23) vaccine.** / 1 to 2 doses if you smoke cigarettes or if you have certain  conditions.  Meningococcal vaccine.** / 1 dose if you are age 8 to 70 years and a Market researcher living in a residence hall, or have one of several medical conditions. You may also need additional booster doses.  Hepatitis A vaccine.** / Consult your health care provider.  Hepatitis B vaccine.** / Consult your health care provider.  Haemophilus influenzae type b (Hib) vaccine.** / Consult your health care provider. Ages 48 to 32  Blood pressure check.** / Every 1 to 2 years.  Lipid and cholesterol check.** / Every 5 years beginning at age 38.  Lung cancer screening. / Every year if you are aged 40 80 years and have a 30-pack-year history of smoking and currently smoke or have quit within the past 15 years. Yearly screening is stopped once you have quit smoking for at least 15 years or develop a health problem that would prevent you from having lung cancer treatment.  Fecal occult blood test (FOBT) of stool. / Every year beginning at age 4 and continuing until age 70. You may not have to do this test if you get a colonoscopy every 10 years.  Flexible sigmoidoscopy** or colonoscopy.** / Every 5 years for a flexible sigmoidoscopy or every 10 years for a colonoscopy beginning at age 76 and continuing until age 62.  Hepatitis C blood test.** / For all people born from [birth year] through 1965 and any individual with known risks for hepatitis C.  Skin self-exam. / Monthly.  Influenza vaccine. / Every year.  Tetanus, diphtheria, and acellular pertussis (Tdap/Td) vaccine.** / Consult your health care provider. 1 dose of Td every 10 years.  Varicella vaccine.** / Consult your health care provider.  Zoster vaccine.** / 1 dose for adults aged 60 years or older.  Measles, mumps, rubella (MMR) vaccine.** / You need at least 1 dose of MMR if you were born in 1957 or later. You may also need a second dose.  Pneumococcal 13-valent conjugate (PCV13) vaccine.** / Consult your health care  provider.  Pneumococcal polysaccharide (PPSV23) vaccine.** / 1 to 2 doses if you smoke cigarettes or if you have certain conditions.  Meningococcal vaccine.** / Consult your health care provider.  Hepatitis A vaccine.** / Consult your health care  provider.  Hepatitis B vaccine.** / Consult your health care provider.  Haemophilus influenzae type b (Hib) vaccine.** / Consult your health care provider. Ages 65 and over  Blood pressure check.** / Every 1 to 2 years.  Lipid and cholesterol check.**/ Every 5 years beginning at age 20.  Lung cancer screening. / Every year if you are aged 55 80 years and have a 30-pack-year history of smoking and currently smoke or have quit within the past 15 years. Yearly screening is stopped once you have quit smoking for at least 15 years or develop a health problem that would prevent you from having lung cancer treatment.  Fecal occult blood test (FOBT) of stool. / Every year beginning at age 50 and continuing until age 75. You may not have to do this test if you get a colonoscopy every 10 years.  Flexible sigmoidoscopy** or colonoscopy.** / Every 5 years for a flexible sigmoidoscopy or every 10 years for a colonoscopy beginning at age 50 and continuing until age 75.  Hepatitis C blood test.** / For all people born from 1945 through 1965 and any individual with known risks for hepatitis C.  Abdominal aortic aneurysm (AAA) screening.** / A one-time screening for ages 65 to 75 years who are current or former smokers.  Skin self-exam. / Monthly.  Influenza vaccine. / Every year.  Tetanus, diphtheria, and acellular pertussis (Tdap/Td) vaccine.** / 1 dose of Td every 10 years.  Varicella vaccine.** / Consult your health care provider.  Zoster vaccine.** / 1 dose for adults aged 60 years or older.  Pneumococcal 13-valent conjugate (PCV13) vaccine.** / Consult your health care provider.  Pneumococcal polysaccharide (PPSV23) vaccine.** / 1 dose for all  adults aged 65 years and older.  Meningococcal vaccine.** / Consult your health care provider.  Hepatitis A vaccine.** / Consult your health care provider.  Hepatitis B vaccine.** / Consult your health care provider.  Haemophilus influenzae type b (Hib) vaccine.** / Consult your health care provider. **Family history and personal history of risk and conditions may change your health care provider's recommendations. Document Released: 06/28/2001 Document Revised: 02/20/2013 Document Reviewed: 09/27/2010 [employer] Patient Information 2014 [employer].  

## 2013-10-10 NOTE — Progress Notes (Signed)
   Subjective:    Patient ID: Timothy Huynh, male    DOB: [birth year], 57 y.o.   MRN: [id number]  HPI Pt here for cpe and labs.  No new complaints.      Review of Systems  Constitutional: Negative.   HENT: Negative for congestion, ear pain, hearing loss, nosebleeds, postnasal drip, rhinorrhea, sinus pressure, sneezing and tinnitus.   Eyes: Negative for photophobia, discharge, itching and visual disturbance.  Respiratory: Negative.   Cardiovascular: Negative.   Gastrointestinal: Negative for abdominal pain, constipation, blood in stool, abdominal distention and anal bleeding.  Endocrine: Negative.   Genitourinary: Negative.   Musculoskeletal: Negative.   Skin: Negative.   Allergic/Immunologic: Negative.   Neurological: Negative for dizziness, weakness, light-headedness, numbness and headaches.  Psychiatric/Behavioral: Negative for suicidal ideas, confusion, sleep disturbance, dysphoric mood, decreased concentration and agitation. The patient is not nervous/anxious.        Objective:   Physical Exam  Nursing note and vitals reviewed. Constitutional: He is oriented to person, place, and time. He appears well-developed and well-nourished. No distress.  HENT:  Head: Normocephalic and atraumatic.  Right Ear: External ear normal.  Left Ear: External ear normal.  Nose: Nose normal.  Mouth/Throat: Oropharynx is clear and moist. No oropharyngeal exudate.  Eyes: Conjunctivae and EOM are normal. Pupils are equal, round, and reactive to light. Right eye exhibits no discharge. Left eye exhibits no discharge.  Neck: Normal range of motion. Neck supple. No JVD present. No thyromegaly present.  Cardiovascular: Normal rate, regular rhythm and intact distal pulses.  Exam reveals no gallop and no friction rub.   No murmur heard. Pulmonary/Chest: Effort normal and breath sounds normal. No respiratory distress. He has no wheezes. He has no rales. He exhibits no tenderness.  Abdominal: Soft. Bowel sounds  are normal. He exhibits no distension and no mass. There is no tenderness. There is no rebound and no guarding.  Genitourinary: Rectum normal, prostate normal and penis normal. Guaiac negative stool.  Musculoskeletal: Normal range of motion. He exhibits no edema and no tenderness.  Lymphadenopathy:    He has no cervical adenopathy.  Neurological: He is alert and oriented to person, place, and time. He displays normal reflexes. He exhibits normal muscle tone.  Skin: Skin is warm and dry. No rash noted. He is not diaphoretic. No erythema. No pallor.  Psychiatric: He has a normal mood and affect. His behavior is normal. Judgment and thought content normal.   Filed Vitals:   10/10/13 0938  BP: 134/90  Pulse: 54  Temp: 98.5 F (36.9 C)  TempSrc: Oral  Height: 5\' 8"  (1.727 m)  Weight: 167 lb 6.4 oz (75.932 kg)  SpO2: 98%          Assessment & Plan:  1. ED (erectile dysfunction)  - sildenafil (VIAGRA) 50 MG tablet; Take 1 tablet (50 mg total) by mouth daily as needed for erectile dysfunction.  Dispense: 10 tablet; Refill: 5  2. Preventative health care ghm utd Check labs See AVS - Basic metabolic panel - CBC with Differential - Hepatic function panel - Lipid panel - POCT urinalysis dipstick - TSH - PSA  3. Other and unspecified hyperlipidemia  - Hepatic function panel - Lipid panel

## 2013-10-10 NOTE — Progress Notes (Signed)
Pre visit review using our clinic review tool, if applicable. No additional management support is needed unless otherwise documented below in the visit note. 

## 2013-10-14 LAB — POCT URINALYSIS DIPSTICK
Bilirubin, UA: NEGATIVE
GLUCOSE UA: NEGATIVE
KETONES UA: NEGATIVE
Leukocytes, UA: NEGATIVE
NITRITE UA: NEGATIVE
Protein, UA: NEGATIVE
RBC UA: NEGATIVE
SPEC GRAV UA: 1.015
Urobilinogen, UA: 0.2
pH, UA: 6.5

## 2013-12-30 ENCOUNTER — Telehealth: Payer: Self-pay

## 2013-12-30 NOTE — Telephone Encounter (Signed)
Patient he has been made aware we can not treat with an OV, He has been advised to keep his apt for tomorrow.    KP

## 2013-12-30 NOTE — Telephone Encounter (Signed)
Caller name:Nalin Relation to pt: Call back [place]:  Reason for call: Timothy Huynh called to see he had his annual sinus infection and was hoping to get something called in so he would not have to come in for a office visit  . I went ahead and made him an appointment to see Percell Miller tomorrow at 4:00. If at all possible he would like something called in.

## 2013-12-31 ENCOUNTER — Encounter: Payer: Self-pay | Admitting: Medical

## 2013-12-31 ENCOUNTER — Ambulatory Visit (INDEPENDENT_AMBULATORY_CARE_PROVIDER_SITE_OTHER): Payer: BC Managed Care – PPO | Admitting: Medical

## 2013-12-31 VITALS — BP 126/72 | HR 67 | Temp 98.0°F | Wt 169.0 lb

## 2013-12-31 DIAGNOSIS — M545 Low back pain, unspecified: Secondary | ICD-10-CM

## 2013-12-31 MED ORDER — BENZONATATE 100 MG PO CAPS: 100.0000 mg | ORAL_CAPSULE | Freq: Three times a day (TID) | ORAL | Status: DC | PRN

## 2013-12-31 MED ORDER — FLUTICASONE PROPIONATE 50 MCG/ACT NA SUSP: 2.0000 | Freq: Every day | NASAL | Status: DC

## 2013-12-31 MED ORDER — MELOXICAM 15 MG PO TABS: 15.0000 mg | ORAL_TABLET | Freq: Every day | ORAL | Status: DC

## 2013-12-31 MED ORDER — CEFDINIR 300 MG PO CAPS: 300.0000 mg | ORAL_CAPSULE | Freq: Two times a day (BID) | ORAL | Status: DC

## 2013-12-31 NOTE — Progress Notes (Signed)
Pre visit review using our clinic review tool, if applicable. No additional management support is needed unless otherwise documented below in the visit note. 

## 2013-12-31 NOTE — Assessment & Plan Note (Signed)
Flare recently after he had coughing episode he states pulled his lower back muscle. He mentioned this at end of exam so did refill his meloxicam. No pain radiating to legs. No saddle anesthesia.

## 2013-12-31 NOTE — Assessment & Plan Note (Signed)
Following likely allergic rhinitis and he does use methotrexate as well. Rx fluticasone, cefdinir antibiotic and benzonatate for cough.

## 2013-12-31 NOTE — Progress Notes (Signed)
   Subjective:    Patient ID: Timothy Huynh, male    DOB: 1955/11/02, 58 y.o.   MRN: [id number]  HPI  Pt states 5 days of nasal congestion and cough. Sinus pain. Some fever felt Sunday and Monday. Some sneezing as well. Blowing out purulent mucous. Pt not a smoker. Recently tried mucinex and sudafed. Pt feels one day of chest congestion. Pt is on methotrexate. Last week at rheumatogist pt states cbc was good.  Recent low back pain after couhing a lot recently. Hx of low back pain. No pain shooting to legs. No saddle anesthesia. He request refill of meloxicam.    Review of Systems  Constitutional: Negative for fever, chills and fatigue.  HENT: Positive for congestion, sinus pressure and sneezing.   Eyes: Negative for pain and itching.  Respiratory: Positive for cough. Negative for shortness of breath and wheezing.   Cardiovascular: Negative for chest pain and palpitations.  Gastrointestinal: Negative.   Musculoskeletal: Positive for back pain and myalgias. Negative for arthralgias, neck pain and neck stiffness.  Skin: Negative for rash.  Hematological: Negative for adenopathy. Does not bruise/bleed easily.  Psychiatric/Behavioral: Negative.        Objective:   Physical Exam  General  Mental Status - Alert. General Appearance - Well groomed. Not in acute distress.  Skin Rashes- No Rashes.  HEENT Head- Normal. Ear Auditory Canal - Left- Normal. Right - Normal.Tympanic Membrane- Left- Redl. Right- Normal. Eye Sclera/Conjunctiva- Left- Normal. Right- Normal. Nose & Sinuses Nasal Mucosa- Left-  Boggy+ Congested. Right-  Boggy + Congested. Lt maxillary sinus pressure. Mouth & Throat Lips: Upper Lip- Normal: no dryness, cracking, pallor, cyanosis, or vesicular eruption. Lower Lip-Normal: no dryness, cracking, pallor, cyanosis or vesicular eruption. Buccal Mucosa- Bilateral- No Aphthous ulcers. Oropharynx- No Discharge or Erythema. Tonsils: Characteristics- Bilateral- No Erythema or  Congestion. Size/Enlargement- Bilateral- No enlargement. Discharge- bilateral-None.  Neck Neck- Supple. No Masses.   Chest and Lung Exam Auscultation: Breath Sounds:-Normal, clear, even and unlabored.  Cardiovascular Auscultation:Rythm- Regular, rate and rhythm. Murmurs & Other Heart Sounds:Ausculatation of the heart reveal- No Murmurs.  Lymphatic Head & Neck General Head & Neck Lymphatics: Bilateral: Description- No Localized lymphadenopathy.  Back No mid lumbar spine tenderness. Lt paraspinal tenderness to palpation.          Assessment & Plan:

## 2013-12-31 NOTE — Patient Instructions (Signed)
You likely have had recent allergic rhinitis followed by sinusitis and early lt side otitis media. I am prescribing fluticasone nasal spray, cefdinir antibiotic and benzonatate for cough. For your back pain I am prescribing mobic again.(you had that before.) No otc nsaids while on mobic. Follow up in 7 days persisting symptoms or as needed.

## 2014-01-21 ENCOUNTER — Encounter: Payer: Self-pay | Admitting: Gastroenterology

## 2014-04-17 ENCOUNTER — Telehealth: Payer: Self-pay | Admitting: Family Medicine

## 2014-04-17 DIAGNOSIS — N529 Male erectile dysfunction, unspecified: Secondary | ICD-10-CM

## 2014-04-17 MED ORDER — SILDENAFIL CITRATE 50 MG PO TABS: 50.0000 mg | ORAL_TABLET | Freq: Every day | ORAL | Status: DC | PRN

## 2014-04-17 NOTE — Telephone Encounter (Signed)
Caller name: Demian Relation to pt: self Call back number: [phone] Pharmacy: [facility]  Reason for call:   Patient is requesting a refill of viagra

## 2014-04-18 ENCOUNTER — Telehealth: Payer: Self-pay | Admitting: *Deleted

## 2014-04-18 NOTE — Telephone Encounter (Signed)
PA for viagra initiated. Awaiting determination. JG//CMA

## 2014-04-21 NOTE — Telephone Encounter (Signed)
Approved effective 03/22/2014 through 16/10/2016. Case ID: [id number]. JG//CMA

## 2015-02-23 ENCOUNTER — Encounter: Payer: Self-pay | Admitting: Family Medicine

## 2015-03-05 ENCOUNTER — Ambulatory Visit (INDEPENDENT_AMBULATORY_CARE_PROVIDER_SITE_OTHER): Payer: BLUE CROSS/BLUE SHIELD | Admitting: Family Medicine

## 2015-03-05 ENCOUNTER — Encounter: Payer: Self-pay | Admitting: Family Medicine

## 2015-03-05 ENCOUNTER — Ambulatory Visit (HOSPITAL_BASED_OUTPATIENT_CLINIC_OR_DEPARTMENT_OTHER)
Admission: RE | Admit: 2015-03-05 | Discharge: 2015-03-05 | Disposition: A | Discharge status: Home / Self Care | Payer: BLUE CROSS/BLUE SHIELD | Source: Ambulatory Visit | Attending: Family Medicine | Admitting: Family Medicine

## 2015-03-05 VITALS — BP 136/84 | HR 55 | Temp 98.3°F | Ht 68.0 in | Wt 176.4 lb

## 2015-03-05 DIAGNOSIS — Z0000 Encounter for general adult medical examination without abnormal findings: Secondary | ICD-10-CM

## 2015-03-05 DIAGNOSIS — M25552 Pain in left hip: Secondary | ICD-10-CM

## 2015-03-05 DIAGNOSIS — M25551 Pain in right hip: Secondary | ICD-10-CM

## 2015-03-05 DIAGNOSIS — Z23 Encounter for immunization: Secondary | ICD-10-CM

## 2015-03-05 DIAGNOSIS — M25559 Pain in unspecified hip: Secondary | ICD-10-CM | POA: Diagnosis present

## 2015-03-05 DIAGNOSIS — M545 Low back pain: Secondary | ICD-10-CM | POA: Insufficient documentation

## 2015-03-05 LAB — CBC WITH DIFFERENTIAL/PLATELET
BASOS PCT: 0.4 % (ref 0.0–3.0)
Basophils Absolute: 0 10*3/uL (ref 0.0–0.1)
EOS ABS: 0.2 10*3/uL (ref 0.0–0.7)
Eosinophils Relative: 4.4 % (ref 0.0–5.0)
HEMATOCRIT: 45.1 % (ref 39.0–52.0)
Hemoglobin: 15.3 g/dL (ref 13.0–17.0)
Lymphocytes Relative: 26.6 % (ref 12.0–46.0)
Lymphs Abs: 1.3 10*3/uL (ref 0.7–4.0)
MCHC: 33.8 g/dL (ref 30.0–36.0)
MCV: 93.4 fl (ref 78.0–100.0)
MONO ABS: 0.4 10*3/uL (ref 0.1–1.0)
Monocytes Relative: 7.9 % (ref 3.0–12.0)
NEUTROS ABS: 3 10*3/uL (ref 1.4–7.7)
Neutrophils Relative %: 60.7 % (ref 43.0–77.0)
PLATELETS: 339 10*3/uL (ref 150.0–400.0)
RBC: 4.83 Mil/uL (ref 4.22–5.81)
RDW: 12.9 % (ref 11.5–15.5)
WBC: 4.9 10*3/uL (ref 4.0–10.5)

## 2015-03-05 LAB — TSH: TSH: 2.27 u[IU]/mL (ref 0.35–4.50)

## 2015-03-05 LAB — LIPID PANEL
CHOL/HDL RATIO: 5
CHOLESTEROL: 205 mg/dL — AB (ref 0–200)
HDL: 38.5 mg/dL — ABNORMAL LOW (ref >39.00)
LDL Cholesterol: 129 mg/dL — ABNORMAL HIGH (ref 0–99)
NonHDL: 166.95
TRIGLYCERIDES: 190 mg/dL — AB (ref 0.0–149.0)
VLDL: 38 mg/dL (ref 0.0–40.0)

## 2015-03-05 LAB — POCT URINALYSIS DIPSTICK
Bilirubin, UA: NEGATIVE
GLUCOSE UA: NEGATIVE
Ketones, UA: NEGATIVE
Leukocytes, UA: NEGATIVE
NITRITE UA: NEGATIVE
PROTEIN UA: NEGATIVE
RBC UA: NEGATIVE
Spec Grav, UA: 1.02
UROBILINOGEN UA: 0.2
pH, UA: 6

## 2015-03-05 LAB — COMPREHENSIVE METABOLIC PANEL
ALT: 41 U/L (ref 0–53)
AST: 20 U/L (ref 0–37)
Albumin: 4.7 g/dL (ref 3.5–5.2)
Alkaline Phosphatase: 66 U/L (ref 39–117)
BUN: 12 mg/dL (ref 6–23)
CO2: 31 mEq/L (ref 19–32)
CREATININE: 0.93 mg/dL (ref 0.40–1.50)
Calcium: 9.9 mg/dL (ref 8.4–10.5)
Chloride: 101 mEq/L (ref 96–112)
GFR: 88.61 mL/min (ref >60.00)
Glucose, Bld: 103 mg/dL — ABNORMAL HIGH (ref 70–99)
Potassium: 4.8 mEq/L (ref 3.5–5.1)
SODIUM: 138 meq/L (ref 135–145)
TOTAL PROTEIN: 7.3 g/dL (ref 6.0–8.3)
Total Bilirubin: 0.6 mg/dL (ref 0.2–1.2)

## 2015-03-05 LAB — HEPATITIS C ANTIBODY: HCV Ab: NEGATIVE

## 2015-03-05 LAB — PSA: PSA: 1.27 ng/mL (ref 0.10–4.00)

## 2015-03-05 NOTE — Progress Notes (Signed)
Patient ID: Timothy Huynh, male    DOB: 04-16-1956  Age: 59 y.o. MRN: [id number]    Subjective:  Subjective HPI Timothy Huynh presents for cpe.  He c/o b/l hip pain and pain in the legs x several weeks.    Review of Systems  Constitutional: Negative for diaphoresis, appetite change, fatigue and unexpected weight change.  Eyes: Negative for pain, redness and visual disturbance.  Respiratory: Negative for cough, chest tightness, shortness of breath and wheezing.   Cardiovascular: Negative for chest pain, palpitations and leg swelling.  Endocrine: Negative for cold intolerance, heat intolerance, polydipsia, polyphagia and polyuria.  Genitourinary: Negative for dysuria, frequency and difficulty urinating.  Neurological: Negative for dizziness, light-headedness, numbness and headaches.    History Past Medical History  Diagnosis Date  . Hypertension   . Hyperlipidemia   . RA (rheumatoid arthritis) ([place])     He has past surgical history that includes Hernia repair.   His family history includes Cancer in his father; Dementia in his mother; Diabetes in his father.He reports that he has never smoked. He has never used smokeless tobacco. He reports that he drinks alcohol. He reports that he does not use illicit drugs.  Current Outpatient Prescriptions on File Prior to Visit  Medication Sig Dispense Refill  . FOLIC ACID PO Take 1 tablet by mouth daily.    . Methotrexate Sodium (METHOTREXATE PO) Take by mouth.    . sildenafil (VIAGRA) 50 MG tablet Take 1 tablet (50 mg total) by mouth daily as needed for erectile dysfunction. 10 tablet 5   No current facility-administered medications on file prior to visit.     Objective:  Objective Physical Exam  Constitutional: He is oriented to person, place, and time. Vital signs are normal. He appears well-developed and well-nourished. He is sleeping.  HENT:  Head: Normocephalic and atraumatic.  Mouth/Throat: Oropharynx is clear and moist.  Eyes: EOM  are normal. Pupils are equal, round, and reactive to light.  Neck: Normal range of motion. Neck supple. No thyromegaly present.  Cardiovascular: Normal rate and regular rhythm.   No murmur heard. Pulmonary/Chest: Effort normal and breath sounds normal. No respiratory distress. He has no wheezes. He has no rales. He exhibits no tenderness.  Musculoskeletal: He exhibits no edema or tenderness.  Neurological: He is alert and oriented to person, place, and time.  Skin: Skin is warm and dry.  Psychiatric: He has a normal mood and affect. His behavior is normal. Judgment and thought content normal.  Nursing note and vitals reviewed.  BP 136/84 mmHg  Pulse 55  Temp(Src) 98.3 F (36.8 C) (Oral)  Ht _0  (1.727 m)  Wt 176 lb 6.4 oz (80.015 kg)  BMI 26.83 kg/m2  SpO2 98% Wt Readings from Last 3 Encounters:  03/05/15 176 lb 6.4 oz (80.015 kg)  12/31/13 169 lb (76.658 kg)  10/10/13 167 lb 6.4 oz (75.932 kg)     Lab Results  Component Value Date   WBC 4.9 03/05/2015   HGB 15.3 03/05/2015   HCT 45.1 03/05/2015   PLT 339.0 03/05/2015   GLUCOSE 103* 03/05/2015   CHOL 205* 03/05/2015   TRIG 190.0* 03/05/2015   HDL 38.50* 03/05/2015   LDLDIRECT 72.3 11/19/2010   LDLCALC 129* 03/05/2015   ALT 41 03/05/2015   AST 20 03/05/2015   NA 138 03/05/2015   K 4.8 03/05/2015   CL 101 03/05/2015   CREATININE 0.93 03/05/2015   BUN 12 03/05/2015   CO2 31 03/05/2015   TSH 2.27 03/05/2015  PSA 1.27 03/05/2015   INR 1.0 09/11/2007   MICROALBUR 0.7 09/27/2012    No results found.   Assessment & Plan:  Plan I have discontinued Mr. Lux methocarbamol, fluticasone, cefdinir, benzonatate, and meloxicam. I am also having him maintain his Methotrexate Sodium (METHOTREXATE PO), FOLIC ACID PO, sildenafil, and cetirizine.  Meds ordered this encounter  Medications  . cetirizine (ZYRTEC) 10 MG tablet    Sig: Take 10 mg by mouth daily.    Problem List Items Addressed This Visit    None    Visit  Diagnoses    Preventative health care    -  Primary    Relevant Orders    Comp Met (CMET) (Completed)    CBC with Differential/Platelet (Completed)    Lipid panel (Completed)    POCT urinalysis dipstick (Completed)    PSA (Completed)    TSH (Completed)    HIV antibody (Completed)    Hepatitis C antibody (Completed)    Need for immunization against influenza        Relevant Orders    Flu Vaccine QUAD 36+ mos IM (Fluarix) (Completed)    Hip pain, bilateral        Relevant Orders    DG HIPS BILAT WITH PELVIS 2V (Completed)       Follow-up: Return in about 1 year (around 03/04/2016), or if symptoms worsen or fail to improve, for annual exam, fasting.  Timothy Koyanagi, DO

## 2015-03-05 NOTE — Patient Instructions (Signed)

## 2015-03-05 NOTE — Progress Notes (Signed)
Pre visit review using our clinic review tool, if applicable. No additional management support is needed unless otherwise documented below in the visit note. 

## 2015-03-06 LAB — HIV ANTIBODY (ROUTINE TESTING W REFLEX): HIV 1&2 Ab, 4th Generation: NONREACTIVE

## 2016-03-14 ENCOUNTER — Encounter: Payer: Self-pay | Admitting: Family Medicine

## 2016-03-14 ENCOUNTER — Ambulatory Visit (INDEPENDENT_AMBULATORY_CARE_PROVIDER_SITE_OTHER): Payer: BLUE CROSS/BLUE SHIELD | Admitting: Family Medicine

## 2016-03-14 VITALS — BP 118/62 | HR 63 | Temp 99.2°F | Resp 16 | Ht 68.0 in | Wt 183.2 lb

## 2016-03-14 DIAGNOSIS — M0579 Rheumatoid arthritis with rheumatoid factor of multiple sites without organ or systems involvement: Secondary | ICD-10-CM

## 2016-03-14 DIAGNOSIS — Z0000 Encounter for general adult medical examination without abnormal findings: Secondary | ICD-10-CM | POA: Diagnosis not present

## 2016-03-14 DIAGNOSIS — Z23 Encounter for immunization: Secondary | ICD-10-CM

## 2016-03-14 DIAGNOSIS — E785 Hyperlipidemia, unspecified: Secondary | ICD-10-CM

## 2016-03-14 LAB — POCT URINALYSIS DIPSTICK
Bilirubin, UA: NEGATIVE
Glucose, UA: NEGATIVE
Ketones, UA: NEGATIVE
LEUKOCYTES UA: NEGATIVE
NITRITE UA: NEGATIVE
PH UA: 6
PROTEIN UA: NEGATIVE
RBC UA: NEGATIVE
Spec Grav, UA: 1.02
UROBILINOGEN UA: NEGATIVE

## 2016-03-14 NOTE — Progress Notes (Signed)
Pre visit review using our clinic review tool, if applicable. No additional management support is needed unless otherwise documented below in the visit note. 

## 2016-03-14 NOTE — Assessment & Plan Note (Signed)
Per rheumatology 

## 2016-03-14 NOTE — Progress Notes (Signed)
000Patient ID: Timothy Huynh, male    DOB: 10-Mar-1956  Age: 60 y.o. MRN: [id number]    Subjective:  Subjective  HPI Timothy Huynh presents for cpe and labs  Review of Systems  Constitutional: Negative for fatigue, fever and unexpected weight change.  HENT: Negative for congestion.   Respiratory: Negative for cough and shortness of breath.   Cardiovascular: Negative for chest pain, palpitations and leg swelling.  Gastrointestinal: Negative for abdominal pain, blood in stool and nausea.  Genitourinary: Negative for dysuria and frequency.  Skin: Negative for rash.  Allergic/Immunologic: Negative for environmental allergies.  Neurological: Negative for dizziness and headaches.  Psychiatric/Behavioral: The patient is not nervous/anxious.     History Past Medical History:  Diagnosis Date  . Hyperlipidemia   . Hypertension   . RA (rheumatoid arthritis) ([place])     He has a past surgical history that includes Hernia repair.   His family history includes Cancer in his father; Dementia in his mother; Diabetes in his father.He reports that he has never smoked. He has never used smokeless tobacco. He reports that he drinks alcohol. He reports that he does not use drugs.  Current Outpatient Prescriptions on File Prior to Visit  Medication Sig Dispense Refill  . cetirizine (ZYRTEC) 10 MG tablet Take 10 mg by mouth daily.    Marland Kitchen FOLIC ACID PO Take 1 tablet by mouth daily.    . Methotrexate Sodium (METHOTREXATE PO) Take by mouth.    . sildenafil (VIAGRA) 50 MG tablet Take 1 tablet (50 mg total) by mouth daily as needed for erectile dysfunction. 10 tablet 5   No current facility-administered medications on file prior to visit.      Objective:  Objective  Physical Exam  Constitutional: He is oriented to person, place, and time. He appears well-developed and well-nourished. No distress.  HENT:  Head: Normocephalic and atraumatic.  Right Ear: External ear normal.  Left Ear: External ear normal.   Nose: Nose normal.  Mouth/Throat: Oropharynx is clear and moist. No oropharyngeal exudate.  Eyes: Conjunctivae and EOM are normal. Pupils are equal, round, and reactive to light. Right eye exhibits no discharge. Left eye exhibits no discharge.  Neck: Normal range of motion. Neck supple. No JVD present. No thyromegaly present.  Cardiovascular: Normal rate, regular rhythm and intact distal pulses.  Exam reveals no gallop and no friction rub.   No murmur heard. Pulmonary/Chest: Effort normal and breath sounds normal. No respiratory distress. He has no wheezes. He has no rales. He exhibits no tenderness.  Abdominal: Soft. Bowel sounds are normal. He exhibits no distension and no mass. There is no tenderness. There is no rebound and no guarding.  Genitourinary: Rectum normal, prostate normal and penis normal. Rectal exam shows guaiac negative stool.  Musculoskeletal: Normal range of motion. He exhibits no edema or tenderness.  Lymphadenopathy:    He has no cervical adenopathy.  Neurological: He is alert and oriented to person, place, and time. He displays normal reflexes. He exhibits normal muscle tone.  Skin: Skin is warm and dry. No rash noted. He is not diaphoretic. No erythema. No pallor.  Psychiatric: He has a normal mood and affect. His behavior is normal. Judgment and thought content normal.  Nursing note and vitals reviewed.  BP 118/62 (BP Location: Left Arm, Patient Position: Sitting, Cuff Size: Normal)   Pulse 63   Temp 99.2 F (37.3 C) (Oral)   Resp 16   Ht 5\' 8"  (1.727 m)   Wt 183 lb 3.2 oz (  83.1 kg)   SpO2 96%   BMI 27.86 kg/m  Wt Readings from Last 3 Encounters:  03/14/16 183 lb 3.2 oz (83.1 kg)  03/05/15 176 lb 6.4 oz (80 kg)  12/31/13 169 lb (76.7 kg)     Lab Results  Component Value Date   WBC 4.9 03/05/2015   HGB 15.3 03/05/2015   HCT 45.1 03/05/2015   PLT 339.0 03/05/2015   GLUCOSE 103 (H) 03/05/2015   CHOL 205 (H) 03/05/2015   TRIG 190.0 (H) 03/05/2015    HDL 38.50 (L) 03/05/2015   LDLDIRECT 72.3 11/19/2010   LDLCALC 129 (H) 03/05/2015   ALT 41 03/05/2015   AST 20 03/05/2015   NA 138 03/05/2015   K 4.8 03/05/2015   CL 101 03/05/2015   CREATININE 0.93 03/05/2015   BUN 12 03/05/2015   CO2 31 03/05/2015   TSH 2.27 03/05/2015   PSA 1.27 03/05/2015   INR 1.0 09/11/2007   MICROALBUR 0.7 09/27/2012    Dg Hips Bilat With Pelvis 2v  0 Result Date: 03/05/2015 CLINICAL DATA:  Pt c/o bilateral hip pain and lower back pain for 3 months, pt denies any injury or trauma to lower back or hips is EXAM: DG HIP (WITH OR WITHOUT PELVIS) 2V BILAT COMPARISON:  None. FINDINGS: There is no evidence of hip fracture or dislocation. There is no evidence of arthropathy or other focal bone abnormality. IMPRESSION: Negative. Electronically Signed   By: Nolon Nations M.D.   On: 03/05/2015 11:56     Assessment & Plan:  Plan  I am having Mr. Littlepage maintain his Methotrexate Sodium (METHOTREXATE PO), FOLIC ACID PO, sildenafil, and cetirizine.  No orders of the defined types were placed in this encounter.   Problem List Items Addressed This Visit      Unprioritized   Preventative health care - Primary    Check labs ghm utd See AVS       Relevant Orders   Lipid panel   Comprehensive metabolic panel   CBC with Differential/Platelet   PSA   TSH   POCT urinalysis dipstick (Completed)   Rheumatoid arthritis involving multiple sites with positive rheumatoid factor [facility])    Per rheumatology       Other Visit Diagnoses    Encounter for immunization       Relevant Orders   Flu Vaccine QUAD 36+ mos IM (Completed)      Follow-up: Return in about 1 year (around 03/14/2017) for annual exam, fasting.  Ann Held, DO

## 2016-03-14 NOTE — Assessment & Plan Note (Signed)
Check labs ghm utd See AVS 

## 2016-03-14 NOTE — Patient Instructions (Signed)

## 2016-03-15 LAB — CBC WITH DIFFERENTIAL/PLATELET
BASOS ABS: 0 10*3/uL (ref 0.0–0.1)
BASOS PCT: 0.2 % (ref 0.0–3.0)
Eosinophils Absolute: 0.2 10*3/uL (ref 0.0–0.7)
Eosinophils Relative: 3 % (ref 0.0–5.0)
HEMATOCRIT: 41.7 % (ref 39.0–52.0)
HEMOGLOBIN: 14.2 g/dL (ref 13.0–17.0)
LYMPHS PCT: 27.7 % (ref 12.0–46.0)
Lymphs Abs: 1.5 10*3/uL (ref 0.7–4.0)
MCHC: 34.2 g/dL (ref 30.0–36.0)
MCV: 92.6 fl (ref 78.0–100.0)
MONOS PCT: 10.6 % (ref 3.0–12.0)
Monocytes Absolute: 0.6 10*3/uL (ref 0.1–1.0)
NEUTROS ABS: 3.1 10*3/uL (ref 1.4–7.7)
Neutrophils Relative %: 58.5 % (ref 43.0–77.0)
PLATELETS: 305 10*3/uL (ref 150.0–400.0)
RBC: 4.5 Mil/uL (ref 4.22–5.81)
RDW: 13.1 % (ref 11.5–15.5)
WBC: 5.3 10*3/uL (ref 4.0–10.5)

## 2016-03-15 LAB — LIPID PANEL
Cholesterol: 192 mg/dL (ref 0–200)
HDL: 39 mg/dL — ABNORMAL LOW (ref >39.00)
Total CHOL/HDL Ratio: 5

## 2016-03-15 LAB — COMPREHENSIVE METABOLIC PANEL
ALBUMIN: 4.6 g/dL (ref 3.5–5.2)
ALT: 32 U/L (ref 0–53)
AST: 10 U/L (ref 0–37)
Alkaline Phosphatase: 63 U/L (ref 39–117)
BILIRUBIN TOTAL: 0.3 mg/dL (ref 0.2–1.2)
BUN: 18 mg/dL (ref 6–23)
CALCIUM: 9.6 mg/dL (ref 8.4–10.5)
CHLORIDE: 99 meq/L (ref 96–112)
CO2: 31 mEq/L (ref 19–32)
CREATININE: 0.97 mg/dL (ref 0.40–1.50)
GFR: 84.11 mL/min (ref >60.00)
Glucose, Bld: 79 mg/dL (ref 70–99)
Potassium: 3.8 mEq/L (ref 3.5–5.1)
Sodium: 139 mEq/L (ref 135–145)
Total Protein: 6.7 g/dL (ref 6.0–8.3)

## 2016-03-15 LAB — PSA: PSA: 1.33 ng/mL (ref 0.10–4.00)

## 2016-03-15 LAB — LDL CHOLESTEROL, DIRECT: LDL DIRECT: 123 mg/dL

## 2016-03-15 LAB — TSH: TSH: 3.27 u[IU]/mL (ref 0.35–4.50)

## 2016-03-15 IMAGING — DX DG HIP (WITH OR WITHOUT PELVIS) 2V BILAT
5 series · 5 of 5 positions shown · non-contrast
Comparison: None.

CLINICAL DATA: Pt c/o bilateral hip pain and lower back pain for 3
months, pt denies any injury or trauma to lower back or hips is

EXAM:
DG HIP (WITH OR WITHOUT PELVIS) 2V BILAT

[pelvis ap]
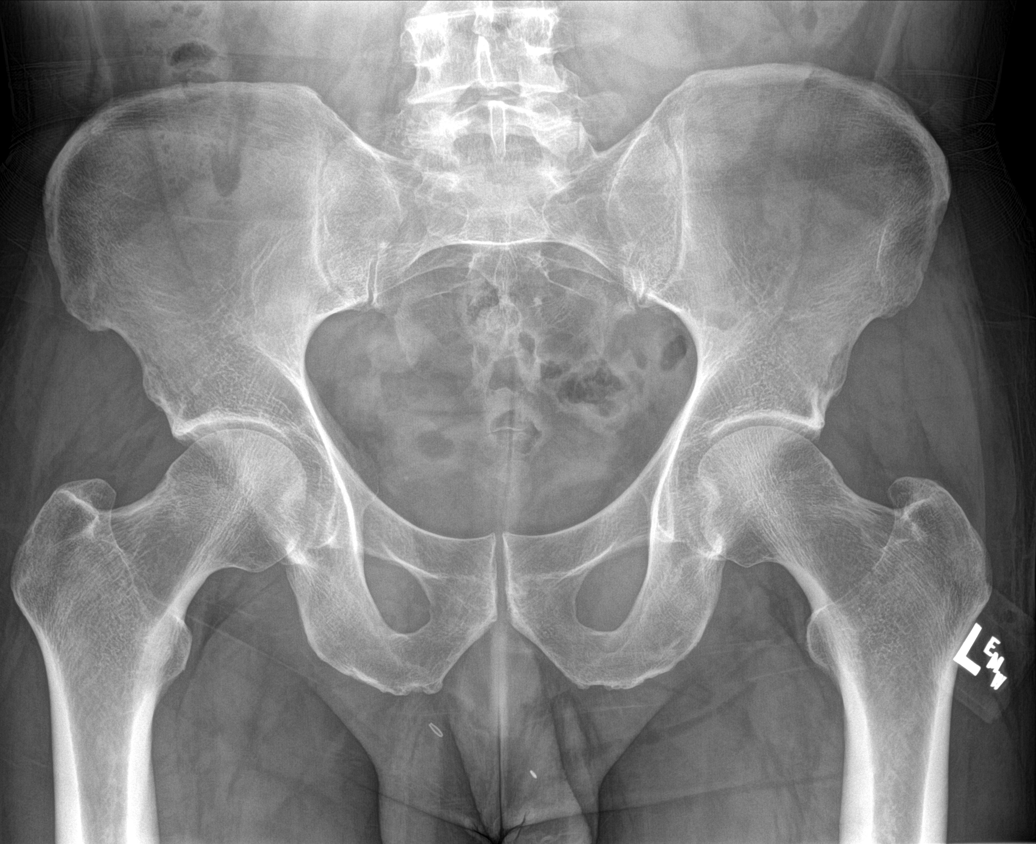

[hip ap (1 of 2)]
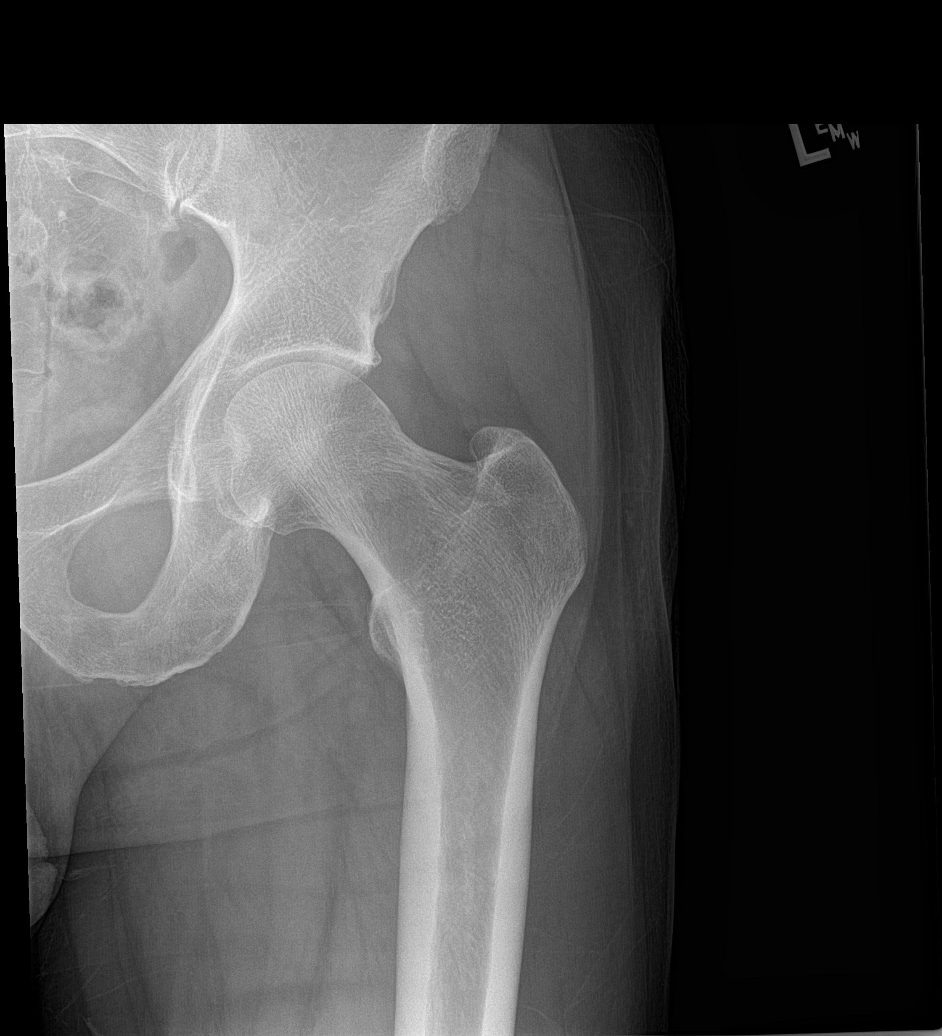

[hip lat (1 of 2)]
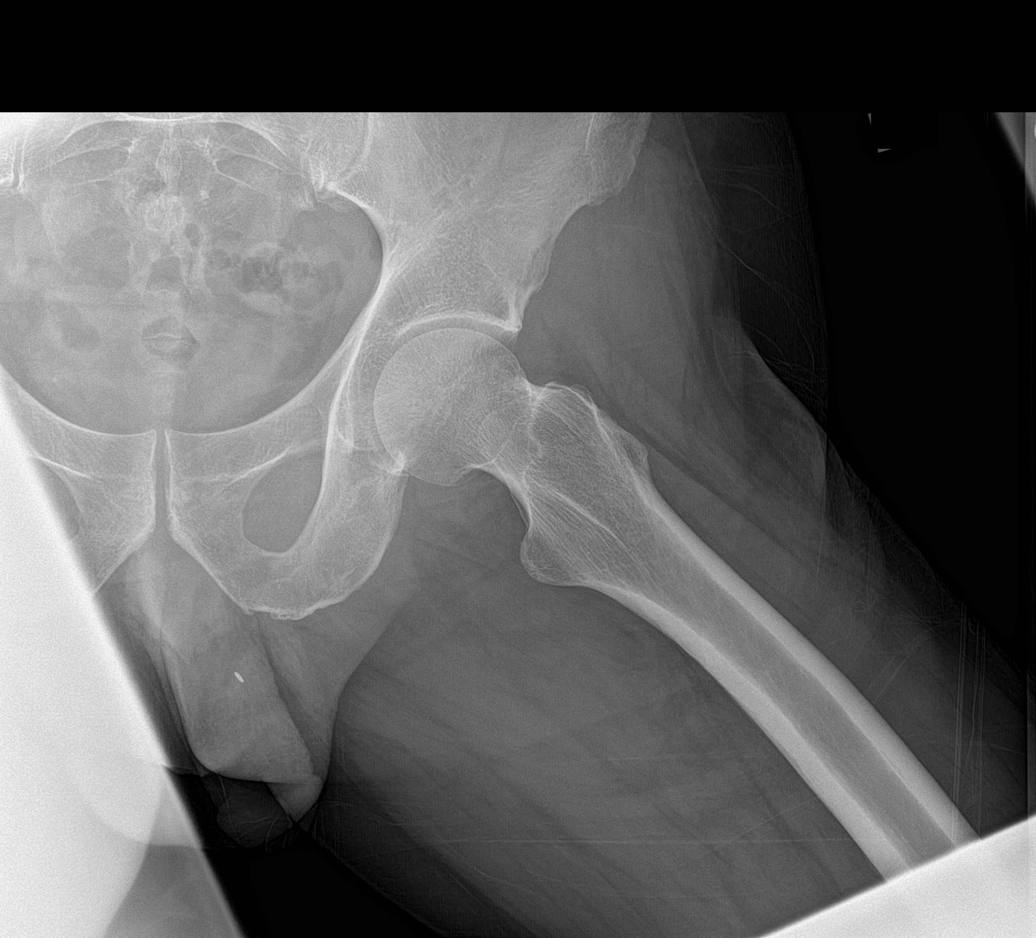

[hip ap (2 of 2)]
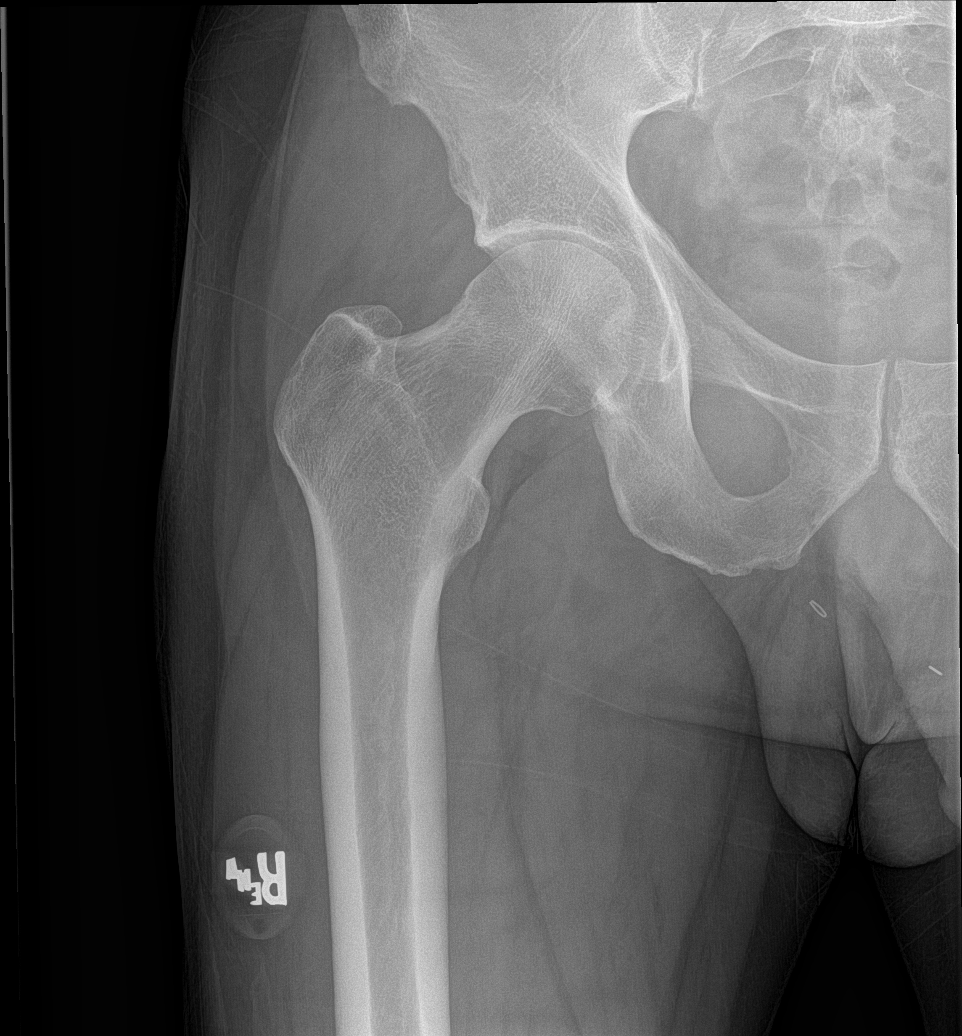

[hip lat (2 of 2)]
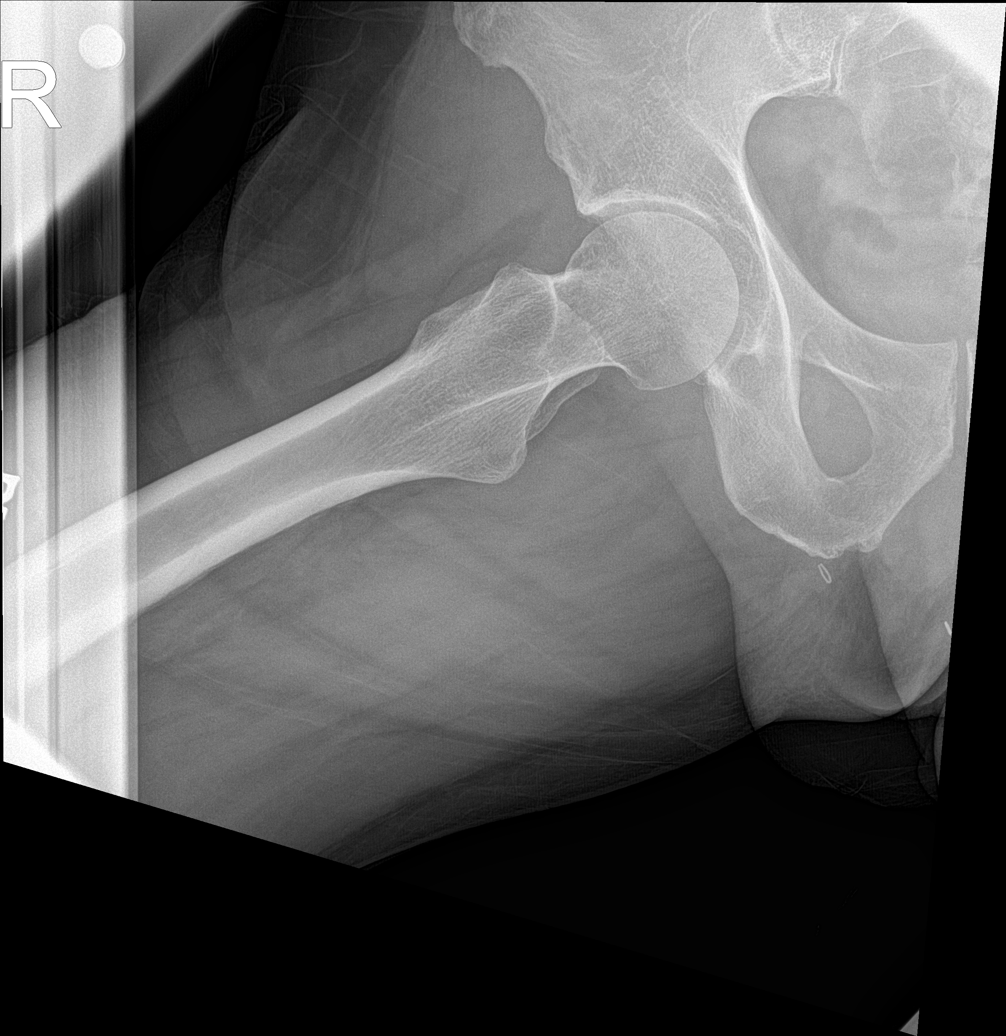

[5 of 5 positions shown; findings below may reference images not displayed]

FINDINGS: There is no evidence of hip fracture or dislocation. There is no
evidence of arthropathy or other focal bone abnormality.
IMPRESSION: Negative.

## 2016-03-16 ENCOUNTER — Emergency Department (HOSPITAL_COMMUNITY)
Admission: EM | Admit: 2016-03-16 | Discharge: 2016-03-17 | Disposition: A | Discharge status: Home / Self Care | Payer: BLUE CROSS/BLUE SHIELD | Attending: Emergency Medicine | Admitting: Emergency Medicine

## 2016-03-16 ENCOUNTER — Emergency Department (HOSPITAL_COMMUNITY): Payer: BLUE CROSS/BLUE SHIELD

## 2016-03-16 ENCOUNTER — Encounter (HOSPITAL_COMMUNITY): Payer: Self-pay

## 2016-03-16 ENCOUNTER — Telehealth: Payer: Self-pay

## 2016-03-16 DIAGNOSIS — I10 Essential (primary) hypertension: Secondary | ICD-10-CM | POA: Diagnosis not present

## 2016-03-16 DIAGNOSIS — R42 Dizziness and giddiness: Secondary | ICD-10-CM | POA: Diagnosis present

## 2016-03-16 DIAGNOSIS — R112 Nausea with vomiting, unspecified: Secondary | ICD-10-CM | POA: Diagnosis not present

## 2016-03-16 DIAGNOSIS — Z79899 Other long term (current) drug therapy: Secondary | ICD-10-CM | POA: Insufficient documentation

## 2016-03-16 LAB — URINALYSIS, ROUTINE W REFLEX MICROSCOPIC
Bilirubin Urine: NEGATIVE
Glucose, UA: 100 mg/dL — AB
Hgb urine dipstick: NEGATIVE
Ketones, ur: 15 mg/dL — AB
Leukocytes, UA: NEGATIVE
Nitrite: NEGATIVE
Protein, ur: NEGATIVE mg/dL
Specific Gravity, Urine: 1.017 (ref 1.005–1.030)
pH: 7.5 (ref 5.0–8.0)

## 2016-03-16 LAB — COMPREHENSIVE METABOLIC PANEL WITH GFR
ALT: 34 U/L (ref 17–63)
AST: 22 U/L (ref 15–41)
Albumin: 4.2 g/dL (ref 3.5–5.0)
Alkaline Phosphatase: 60 U/L (ref 38–126)
Anion gap: 10 (ref 5–15)
BUN: 11 mg/dL (ref 6–20)
CO2: 22 mmol/L (ref 22–32)
Calcium: 9.2 mg/dL (ref 8.9–10.3)
Chloride: 103 mmol/L (ref 101–111)
Creatinine, Ser: 0.97 mg/dL (ref 0.61–1.24)
GFR calc Af Amer: >60 mL/min
GFR calc non Af Amer: >60 mL/min
Glucose, Bld: 182 mg/dL — ABNORMAL HIGH (ref 65–99)
Potassium: 4.2 mmol/L (ref 3.5–5.1)
Sodium: 135 mmol/L (ref 135–145)
Total Bilirubin: 0.9 mg/dL (ref 0.3–1.2)
Total Protein: 6.5 g/dL (ref 6.5–8.1)

## 2016-03-16 LAB — CBC WITH DIFFERENTIAL/PLATELET
Basophils Absolute: 0 K/uL (ref 0.0–0.1)
Basophils Relative: 0 %
Eosinophils Absolute: 0.1 K/uL (ref 0.0–0.7)
Eosinophils Relative: 1 %
HCT: 39.9 % (ref 39.0–52.0)
Hemoglobin: 14.1 g/dL (ref 13.0–17.0)
Lymphocytes Relative: 7 %
Lymphs Abs: 1 K/uL (ref 0.7–4.0)
MCH: 31.9 pg (ref 26.0–34.0)
MCHC: 35.3 g/dL (ref 30.0–36.0)
MCV: 90.3 fL (ref 78.0–100.0)
Monocytes Absolute: 1.1 K/uL — ABNORMAL HIGH (ref 0.1–1.0)
Monocytes Relative: 8 %
Neutro Abs: 12.3 K/uL — ABNORMAL HIGH (ref 1.7–7.7)
Neutrophils Relative %: 84 %
Platelets: 265 K/uL (ref 150–400)
RBC: 4.42 MIL/uL (ref 4.22–5.81)
RDW: 12.4 % (ref 11.5–15.5)
WBC: 14.4 K/uL — ABNORMAL HIGH (ref 4.0–10.5)

## 2016-03-16 LAB — I-STAT TROPONIN, ED: Troponin i, poc: 0.05 ng/mL (ref 0.00–0.08)

## 2016-03-16 MED ORDER — SODIUM CHLORIDE 0.9 % IV BOLUS (SEPSIS)
1000.0000 mL | Freq: Once | INTRAVENOUS | Status: AC
  Administered 2016-03-16: 1000 mL via INTRAVENOUS

## 2016-03-16 MED ORDER — MECLIZINE HCL 25 MG PO TABS: 25.0000 mg | ORAL_TABLET | Freq: Three times a day (TID) | ORAL | 0 refills | Status: DC | PRN

## 2016-03-16 MED ORDER — MECLIZINE HCL 25 MG PO TABS
25.0000 mg | ORAL_TABLET | Freq: Once | ORAL | Status: AC
  Administered 2016-03-16: 25 mg via ORAL
  Filled 2016-03-16: qty 1

## 2016-03-16 MED ORDER — ONDANSETRON HCL 4 MG/2ML IJ SOLN
4.0000 mg | Freq: Once | INTRAMUSCULAR | Status: AC
  Administered 2016-03-16: 4 mg via INTRAVENOUS
  Filled 2016-03-16: qty 2

## 2016-03-16 NOTE — ED Triage Notes (Signed)
Pt went to climb up a ladder when he felt dizzy pt en route was found to have periods of Bradycardia

## 2016-03-16 NOTE — Telephone Encounter (Signed)
Patient returning call.

## 2016-03-16 NOTE — ED Provider Notes (Signed)
Timothy Huynh   CSN: [id number] Arrival date & time: 03/16/16  1942     History   Chief Complaint Chief Complaint  Patient presents with  . Dizziness    episode of dizziness his evening     HPI Timothy Huynh is a 60 y.o. male with a past medical history of HTN, HLD, rheumatoid arthritis who presents to the ED today complaining of dizziness. Patient states that last night before he went to bed he had an episode of dizziness which she describes as feeling lightheaded as if he was going to pass out. He states he just went to bed and woke up this morning feeling okay. Around 6:00 PM he had another episode of dizziness while he was at work. He climbed the top of a very tall ladder where he continued to feel dizzy so he climbed back down. He states the dizziness got worse, he began sweating and feeling nauseated. He then began to vomit. Patient also reports associated tingling sensation in his bilateral fingertips. He denies having any chest pain, shortness of breath, loss of consciousness. Patient states he had a similar episode like this 5 or 6 years ago and was told that this was vertigo.   HPI  Past Medical History:  Diagnosis Date  . Hyperlipidemia   . Hypertension   . RA (rheumatoid arthritis) [facility])     Patient Active Problem List   Diagnosis Date Noted  . Rheumatoid arthritis involving multiple sites with positive rheumatoid factor ([place]) 03/14/2016  . Preventative health care 03/14/2016  . Multiple joint pain 02/11/2013  . Sinusitis 05/24/2012  . ALLERGY UNSPECIFIED NOT ELSEWHERE CLASSIFIED 03/03/2010  . NEVI, MULTIPLE 02/17/2010  . ERECTILE DYSFUNCTION, NON-ORGANIC 02/17/2010  . LOW BACK PAIN, ACUTE 12/14/2009  . SKIN RASH 12/22/2008  . HYPERLIPIDEMIA 08/29/2008  . ELEVATED BLOOD PRESSURE WITHOUT DIAGNOSIS OF HYPERTENSION 09/18/2007  . SYNCOPE 09/11/2007  . URTICARIA 07/06/2007  . HERNIORRHAPHY, HX OF 07/06/2007    Past Surgical History:  Procedure  Laterality Date  . HERNIA REPAIR         Home Medications    Prior to Admission medications   Medication Sig Start Date End Date Taking? Authorizing Provider  cetirizine (ZYRTEC) 10 MG tablet Take 10 mg by mouth daily.   Yes Historical Provider, MD  folic acid (FOLVITE) 1 MG tablet Take 1 mg by mouth daily. 01/21/16  Yes Historical Provider, MD  Influenza vac split quadrivalent PF (FLUZONE QUADRIVALENT) 0.5 ML injection Inject 0.5 mLs into the muscle once.   Yes Historical Provider, MD  methotrexate (RHEUMATREX) 2.5 MG tablet Take 10 mg by mouth every Monday. 01/21/16  Yes Historical Provider, MD    Family History Family History  Problem Relation Age of Onset  . Cancer Father     Oral --angioplasty expired from cancer  . Diabetes Father   . Dementia Mother     Social History Social History  Substance Use Topics  . Smoking status: Never Smoker  . Smokeless tobacco: Never Used  . Alcohol use Yes     Allergies   Review of patient's allergies indicates no known allergies.   Review of Systems Review of Systems  All other systems reviewed and are negative.    Physical Exam Updated Vital Signs BP 156/75 (BP Location: Right Arm)   Pulse (!) 56   Temp 97.6 F (36.4 C) (Oral)   Resp 18   Ht 5\' 6"  (1.676 m)   Wt 81.6 kg   SpO2 100%  BMI 29.05 kg/m   Physical Exam  Constitutional: He is oriented to person, place, and time. He appears well-developed and well-nourished. No distress.  HENT:  Head: Normocephalic and atraumatic.  Mouth/Throat: No oropharyngeal exudate.  Eyes: Conjunctivae and EOM are normal. Pupils are equal, round, and reactive to light. Right eye exhibits no discharge. Left eye exhibits no discharge. No scleral icterus.  Cardiovascular: Regular rhythm, normal heart sounds and intact distal pulses.  Exam reveals no gallop and no friction rub.   No murmur heard. Pulmonary/Chest: Effort normal and breath sounds normal. No respiratory distress. He has no  wheezes. He has no rales. He exhibits no tenderness.  Abdominal: Soft. He exhibits no distension. There is no tenderness. There is no guarding.  Musculoskeletal: Normal range of motion. He exhibits no edema.  Neurological: He is alert and oriented to person, place, and time. No cranial nerve deficit.  Strength 5/5 throughout. No sensory deficits. No gait abnormality. No dysmetria. No slurred speech. No facial droop. Negative pronator drift.    Skin: Skin is warm and dry. No rash noted. He is not diaphoretic. No erythema. No pallor.  Psychiatric: He has a normal mood and affect. His behavior is normal.  Nursing Huynh and vitals reviewed.    ED Treatments / Results  Labs (all labs ordered are listed, but only abnormal results are displayed) Labs Reviewed  COMPREHENSIVE METABOLIC PANEL - Abnormal; Notable for the following:       Result Value   Glucose, Bld 182 (*)    All other components within normal limits  CBC WITH DIFFERENTIAL/PLATELET - Abnormal; Notable for the following:    WBC 14.4 (*)    Neutro Abs 12.3 (*)    Monocytes Absolute 1.1 (*)    All other components within normal limits  URINALYSIS, ROUTINE W REFLEX MICROSCOPIC (NOT AT [facility]) - Abnormal; Notable for the following:    Glucose, UA 100 (*)    Ketones, ur 15 (*)    All other components within normal limits  I-STAT TROPOININ, ED    EKG  EKG Interpretation  Date/Time:  Wednesday March 16 2016 19:59:00 EDT Ventricular Rate:  49 PR Interval:    QRS Duration: 95 QT Interval:  479 QTC Calculation: 433 R Axis:   80 Text Interpretation:  Sinus bradycardia No significant change was found Confirmed by Florina Ou  MD, Jenny Reichmann ([id number]) on 03/17/2016 9:57:26 AM       Radiology Dg Chest 2 View  Result Date: 03/16/2016 CLINICAL DATA:  Acute onset of dizziness, nausea, vomiting, bilateral hand tingling and shortness of breath. Initial encounter. EXAM: CHEST  2 VIEW COMPARISON:  None. FINDINGS: The lungs are well-aerated.  Peribronchial thickening is noted. Mild bibasilar atelectasis is seen. There is no evidence of pleural effusion or pneumothorax. The heart is normal in size; the mediastinal contour is within normal limits. No acute osseous abnormalities are seen. IMPRESSION: Peribronchial thickening noted.  Mild bibasilar atelectasis seen. Electronically Signed   By: Garald Balding M.D.   On: 03/16/2016 21:27   Ct Head Wo Contrast  Result Date: 03/16/2016 CLINICAL DATA:  Dizziness.  Near syncope.  Bradycardia.  Nausea. EXAM: CT HEAD WITHOUT CONTRAST TECHNIQUE: Contiguous axial images were obtained from the base of the skull through the vertex without intravenous contrast. COMPARISON:  None. FINDINGS: Brain: No evidence of parenchymal hemorrhage or extra-axial fluid collection. No mass lesion, mass effect, or midline shift. No CT evidence of acute infarction. Cerebral volume is age appropriate. No ventriculomegaly. Vascular: No hyperdense vessel  or unexpected calcification. Skull: No evidence of calvarial fracture. Sinuses/Orbits: Mucous retention cyst versus polyp in the inferior left maxillary sinus. No fluid levels in the paranasal sinuses. Other:  The mastoid air cells are unopacified. IMPRESSION: 1.  No evidence of acute intracranial abnormality. 2. Mucous retention cyst versus polyp in the inferior left maxillary sinus. Electronically Signed   By: Ilona Sorrel M.D.   On: 03/16/2016 21:50    Procedures Procedures (including critical care time)  Medications Ordered in ED Medications  ondansetron (ZOFRAN) injection 4 mg (not administered)  sodium chloride 0.9 % bolus 1,000 mL (not administered)     Initial Impression / Assessment and Plan / ED Course  I have reviewed the triage vital signs and the nursing notes.  Pertinent labs & imaging results that were available during my care of the patient were reviewed by me and considered in my medical decision making (see chart for details).  Clinical Course    Otherwise healthy 60 y.o M presents to the ED today c/o sudden onset dizziness with associated N/V. Pt was noted to have HR  In the 40's-50's by EMS. On presentation to ED, pt is actively vomiting in exam room. HR is 50bpm. NO associated CP, SOB. Pt states he had a similar episode to this 5 years ago and had complete workup. Pt was told that he had vertigo at that time. EKG shows sinus bradycardia. No sign of ischemia. BP 120/80, normotensive. Lab work including troponin unremarkable. No neuro deficits on exam. CT head unremarkable. Pt given 2 L of fluids, meclizine with significant improvement of symptoms. Pt no longer dizziness. No further episodes of emesis while in ED. HR now 60 bpm. Pt has Low HEART score, doubt ACS. Pt remained normotensive throughout ED stay. Symptoms likely related to vertigo. Will d/c with meclizine rx. Follow up with PCP. Return precautions outlined in patient discharge instructions.   Patient was discussed with and seen by Dr. Roderic Palau who agrees with the treatment plan.     Final Clinical Impressions(s) / ED Diagnoses   Final diagnoses:  None    New Prescriptions New Prescriptions   No medications on file     Carlos Levering, PA-C 03/22/16 1223    Milton Ferguson, MD 03/22/16 1441

## 2016-03-16 NOTE — Discharge Instructions (Signed)
Drink plenty of fluids. Take Meclizine as needed for dizziness. Follow up with your primary care provider for re-evaluation. Return to the ED if you experience severe worsening of your symptoms, loss of consciousness, chest pain, weakness.

## 2016-03-16 NOTE — ED Notes (Signed)
Patient transported to X-ray 

## 2016-03-16 NOTE — Telephone Encounter (Signed)
I have returned patient call regarding lab results. Tl/CMA

## 2016-03-16 NOTE — Addendum Note (Signed)
Addended by: Henryetta Cellar on: 03/16/2016 01:03 PM   Modules accepted: Orders

## 2016-03-17 ENCOUNTER — Encounter: Payer: Self-pay | Admitting: Family Medicine

## 2016-03-17 DIAGNOSIS — R739 Hyperglycemia, unspecified: Secondary | ICD-10-CM

## 2016-03-18 ENCOUNTER — Encounter: Payer: Self-pay | Admitting: Medical

## 2016-03-18 ENCOUNTER — Ambulatory Visit (INDEPENDENT_AMBULATORY_CARE_PROVIDER_SITE_OTHER): Payer: BLUE CROSS/BLUE SHIELD | Admitting: Medical

## 2016-03-18 VITALS — BP 118/78 | Temp 97.8°F | Ht 68.0 in | Wt 178.4 lb

## 2016-03-18 DIAGNOSIS — R42 Dizziness and giddiness: Secondary | ICD-10-CM | POA: Diagnosis not present

## 2016-03-18 LAB — CBC WITH DIFFERENTIAL/PLATELET
Basophils Absolute: 0 K/uL (ref 0.0–0.1)
Basophils Relative: 0.3 % (ref 0.0–3.0)
Eosinophils Absolute: 0.1 K/uL (ref 0.0–0.7)
Eosinophils Relative: 2.6 % (ref 0.0–5.0)
HCT: 43.1 % (ref 39.0–52.0)
Hemoglobin: 14.9 g/dL (ref 13.0–17.0)
Lymphocytes Relative: 27.8 % (ref 12.0–46.0)
Lymphs Abs: 1.6 K/uL (ref 0.7–4.0)
MCHC: 34.6 g/dL (ref 30.0–36.0)
MCV: 92.2 fl (ref 78.0–100.0)
Monocytes Absolute: 0.5 K/uL (ref 0.1–1.0)
Monocytes Relative: 8.9 % (ref 3.0–12.0)
Neutro Abs: 3.4 K/uL (ref 1.4–7.7)
Neutrophils Relative %: 60.4 % (ref 43.0–77.0)
Platelets: 363 K/uL (ref 150.0–400.0)
RBC: 4.68 Mil/uL (ref 4.22–5.81)
RDW: 13.4 % (ref 11.5–15.5)
WBC: 5.7 K/uL (ref 4.0–10.5)

## 2016-03-18 LAB — COMPREHENSIVE METABOLIC PANEL
ALT: 28 U/L (ref 0–53)
AST: 11 U/L (ref 0–37)
Albumin: 4.7 g/dL (ref 3.5–5.2)
Alkaline Phosphatase: 61 U/L (ref 39–117)
BUN: 12 mg/dL (ref 6–23)
CALCIUM: 9.5 mg/dL (ref 8.4–10.5)
CHLORIDE: 101 meq/L (ref 96–112)
CO2: 30 meq/L (ref 19–32)
Creatinine, Ser: 0.96 mg/dL (ref 0.40–1.50)
GFR: 85.12 mL/min (ref >60.00)
GLUCOSE: 106 mg/dL — AB (ref 70–99)
Potassium: 4.1 mEq/L (ref 3.5–5.1)
Sodium: 138 mEq/L (ref 135–145)
Total Bilirubin: 0.5 mg/dL (ref 0.2–1.2)
Total Protein: 7.3 g/dL (ref 6.0–8.3)

## 2016-03-18 LAB — GLUCOSE, POCT (MANUAL RESULT ENTRY): POC Glucose: 101 mg/dL — AB (ref 70–99)

## 2016-03-18 MED ORDER — AMOXICILLIN-POT CLAVULANATE 875-125 MG PO TABS: 1.0000 | ORAL_TABLET | Freq: Two times a day (BID) | ORAL | 0 refills | Status: DC

## 2016-03-18 NOTE — Progress Notes (Signed)
Pre visit review using our clinic review tool, if applicable. No additional management support is needed unless otherwise documented below in the visit note. 

## 2016-03-18 NOTE — Telephone Encounter (Signed)
Will you call pt and see how he is on monday. Has his dizziness/vertigo began to taper off or resolve?

## 2016-03-18 NOTE — Patient Instructions (Addendum)
For your dizziness/vertigo will advise continue the antivert. But use every 8 hours around the clock for next 2-3 days.  Please get cbc, cmp today in office.  Check bp and pulse when he/if has events.    If any cardiac or neurologic  signs or symptoms as advised then [facility]. Since in that event  may need MRI.  If symptoms persist by Monday consider neruologist, ent or PT referral.  Follow up in 7 days or as needed  On exam mild central redness to lt tm Since approaching the weekend will make augmentin antibiotic available in event you get obvious ear pain.   Dizziness Dizziness is a common problem. It is a feeling of unsteadiness or light-headedness. You may feel like you are about to faint. Dizziness can lead to injury if you stumble or fall. Anyone can become dizzy, but dizziness is more common in older adults. This condition can be caused by a number of things, including medicines, dehydration, or illness. HOME CARE INSTRUCTIONS Taking these steps may help with your condition: Eating and Drinking  Drink enough fluid to keep your urine clear or pale yellow. This helps to keep you from becoming dehydrated. Try to drink more clear fluids, such as water.  Do not drink alcohol.  Limit your caffeine intake if directed by your health care provider.  Limit your salt intake if directed by your health care provider. Activity  Avoid making quick movements.  Rise slowly from chairs and steady yourself until you feel okay.  In the morning, first sit up on the side of the bed. When you feel okay, stand slowly while you hold onto something until you know that your balance is fine.  Move your legs often if you need to stand in one place for a long time. Tighten and relax your muscles in your legs while you are standing.  Do not drive or operate heavy machinery if you feel dizzy.  Avoid bending down if you feel dizzy. Place items in your home so that they are easy for you to reach  without leaning over. Lifestyle  Do not use any tobacco products, including cigarettes, chewing tobacco, or electronic cigarettes. If you need help quitting, ask your health care provider.  Try to reduce your stress level, such as with yoga or meditation. Talk with your health care provider if you need help. General Instructions  Watch your dizziness for any changes.  Take medicines only as directed by your health care provider. Talk with your health care provider if you think that your dizziness is caused by a medicine that you are taking.  Tell a friend or a family member that you are feeling dizzy. If he or she notices any changes in your behavior, have this person call your health care provider.  Keep all follow-up visits as directed by your health care provider. This is important. SEEK MEDICAL CARE IF:  Your dizziness does not go away.  Your dizziness or light-headedness gets worse.  You feel nauseous.  You have reduced hearing.  You have new symptoms.  You are unsteady on your feet or you feel like the room is spinning. SEEK IMMEDIATE MEDICAL CARE IF:  You vomit or have diarrhea and are unable to eat or drink anything.  You have problems talking, walking, swallowing, or using your arms, hands, or legs.  You feel generally weak.  You are not thinking clearly or you have trouble forming sentences. It may take a friend or family  member to notice this.  You have chest pain, abdominal pain, shortness of breath, or sweating.  Your vision changes.  You notice any bleeding.  You have a headache.  You have neck pain or a stiff neck.  You have a fever.   This information is not intended to replace advice given to you by your health care provider. Make sure you discuss any questions you have with your health care provider.   Document Released: 10/26/2000 Document Revised: 09/16/2014 Document Reviewed: 04/28/2014 Elsevier Interactive Patient Education [employer].

## 2016-03-18 NOTE — Telephone Encounter (Signed)
I put in future a1c. Cbc drawn on Friday. Can you add that to blood drawn. Order placed on Friday as future.

## 2016-03-18 NOTE — Progress Notes (Signed)
Subjective:    Patient ID: Timothy Huynh, male    DOB: [birth year], 60 y.o.   MRN: [id number]  HPI  Pt in for follow up for dizziness since Wednesday night. Pt states he got very dizzy, nausea, and vomited all night. Pt went to the ED.   Pt had negative CT of his head. Pt had wbc elevated  And moderate sugar elevation the other day. No infection signs or symptoms. No ear pain. No head trauma and no fall. Pt states years ago had vertigo that was similar to this event and then tapered off after about 5 days.  Since Thursday very early am has been getting intermittent waves of dizziness/vertigo that lasts for about a minute with each event. Occurring about 3-4 times a day. No cardiac symptoms with dizziness. Also no associated gross motor or sensory function deficits with brief episodes.   Pt told some dehydration the other night. He was rehydrated by iv fluids  in ED. Pt had flu vaccine on Monday.   Pt had vertigo in 2009. Lasted about same time.    Review of Systems  Constitutional: Negative for chills, fatigue and fever.  HENT: Negative for congestion, ear pain, nosebleeds, postnasal drip, sinus pressure and sneezing.   Respiratory: Negative for cough, chest tightness, shortness of breath and wheezing.   Cardiovascular: Negative for chest pain and palpitations.  Gastrointestinal: Negative for abdominal pain, constipation, diarrhea, rectal pain and vomiting.       No nausea or vomiting since first day.  Musculoskeletal: Negative for back pain.  Neurological: Positive for dizziness and light-headedness. Negative for tremors, seizures, syncope, facial asymmetry, speech difficulty, weakness, numbness and headaches.       See hpi.  Hematological: Negative for adenopathy. Does not bruise/bleed easily.  Psychiatric/Behavioral: Negative for behavioral problems, confusion, decreased concentration, dysphoric mood, sleep disturbance and suicidal ideas. The patient is not nervous/anxious.    When dizzy episodes gets frustrated.    Past Medical History:  Diagnosis Date  . Hyperlipidemia   . Hypertension   . RA (rheumatoid arthritis) ([place])      Social History   Social History  . Marital status: Married    Spouse name: N/A  . Number of children: N/A  . Years of education: N/A   Occupational History  . TE Engineer, [employer]   Social History Main Topics  . Smoking status: Never Smoker  . Smokeless tobacco: Never Used  . Alcohol use Yes  . Drug use: No  . Sexual activity: Yes    Partners: Female   Other Topics Concern  . Not on file   Social History Narrative   Exercise--- 2-3 x a week    Past Surgical History:  Procedure Laterality Date  . HERNIA REPAIR      Family History  Problem Relation Age of Onset  . Cancer Father     Oral --angioplasty expired from cancer  . Diabetes Father   . Dementia Mother     No Known Allergies  Current Outpatient Prescriptions on File Prior to Visit  Medication Sig Dispense Refill  . cetirizine (ZYRTEC) 10 MG tablet Take 10 mg by mouth daily.    . folic acid (FOLVITE) 1 MG tablet Take 1 mg by mouth daily.    . Influenza vac split quadrivalent PF (FLUZONE QUADRIVALENT) 0.5 ML injection Inject 0.5 mLs into the muscle once.    . meclizine (ANTIVERT) 25 MG tablet Take 1 tablet (25 mg total) by mouth 3 (three) times daily  as needed for dizziness. 30 tablet 0  . methotrexate (RHEUMATREX) 2.5 MG tablet Take 10 mg by mouth every Monday.     No current facility-administered medications on file prior to visit.     BP 118/78 (BP Location: Left Arm)   Temp 97.8 F (36.6 C) (Oral)   Ht 5\' 8"  (1.727 m)   Wt 178 lb 6.4 oz (80.9 kg)   BMI 27.13 kg/m       Objective:   Physical Exam  General Mental Status- Alert. General Appearance- Not in acute distress.   Heent- normal but faint red area in central portion of lt tm.  Skin General: Color- Normal Color. Moisture- Normal Moisture.  Neck Carotid Arteries-  Normal color. Moisture- Normal Moisture. No carotid bruits. No JVD.  Chest and Lung Exam Auscultation: Breath Sounds:-Normal.  Cardiovascular Auscultation:Rythm- Regular. Murmurs & Other Heart Sounds:Auscultation of the heart reveals- No Murmurs.  Abdomen Inspection:-Inspeection Normal. Palpation/Percussion:Note:No mass. Palpation and Percussion of the abdomen reveal- Non Tender, Non Distended + BS, no rebound or guarding.    Neurologic Cranial Nerve exam:- CN III-XII intact(No nystagmus), symmetric smile. Drift Test:- No drift. Romberg Exam:- Negative.  Heal to Toe Gait exam:-Normal. Finger to Nose:- Normal/Intact Strength:- 5/5 equal and symmetric strength both upper and lower extremities. Lying supine- and turning head no vertigo. 2 episodes of dizzy spell occurred during interview but not on exam. Both lasted about 1 minute then resolved readily.      Assessment & Plan:  For your dizziness/vertigo will advise continue the antivert. But use every 8 hours around the clock for next 2-3 days.  Please get cbc, cmp today in office.  Check bp and pulse when he/if has events. (counseled on pulse reading that would indicate ED evaluation). Did this since she thought first night of dizzy episode that his heart rate may of been high when his bp was low.  If any cardiac or neurologic  signs or symptoms as advised then [facility]. Since in that event  may need MRI.  If symptoms persist by Monday consider neruologist, ent or PT referral.  Follow up in 7 days or as needed  On exam mild central redness to lt tm Since approaching the weekend will make augmentin antibiotic available in event you get obvious ear pain.  Geniene List, Percell Miller, PA-C

## 2016-03-21 NOTE — Telephone Encounter (Signed)
Good that has no  dizziness today. But need to know that the nagging HA resolves or not. If not would want to go ahead and refer to neurologist. Ask pt to give [place] update on Wed. Sooner if ha worsens.

## 2016-03-21 NOTE — Telephone Encounter (Addendum)
Called to follow up with patient.  Pt states he continues to have dizziness/vertigo spells despite taking meclizine around the clock.  Today no dizziness.  He also has a nagging headache-back of his head.  No changes in vision.  States he's eating and drinking fine and is able to keep food down.  No episode of vomiting since Saturday (November 4th).  Per Percell Miller, if symptoms persist by Monday consider neruologist, ent or PT referral.  Pt does not want to referred at this time, but says he will call back if he changes his mind.

## 2016-03-22 NOTE — Progress Notes (Signed)
Pt has seen results on MyChart and message also sent for patient to call back if any questions.

## 2016-06-09 ENCOUNTER — Encounter: Payer: Self-pay | Admitting: Family Medicine

## 2016-06-09 ENCOUNTER — Ambulatory Visit (INDEPENDENT_AMBULATORY_CARE_PROVIDER_SITE_OTHER): Payer: BLUE CROSS/BLUE SHIELD | Admitting: Family Medicine

## 2016-06-09 VITALS — BP 112/68 | HR 103 | Temp 101.6°F | Ht 68.0 in | Wt 181.2 lb

## 2016-06-09 DIAGNOSIS — J0100 Acute maxillary sinusitis, unspecified: Secondary | ICD-10-CM

## 2016-06-09 MED ORDER — DOXYCYCLINE HYCLATE 100 MG PO TABS: 100.0000 mg | ORAL_TABLET | Freq: Two times a day (BID) | ORAL | 0 refills | Status: DC

## 2016-06-09 NOTE — Patient Instructions (Signed)
If you start to have shortness of breath, inability to keep things down by mouth, or general worsening of symptoms, let us know or seek emergent care.  Continue to push fluids, practice good hand hygiene, and cover your mouth if you cough.

## 2016-06-09 NOTE — Progress Notes (Signed)
Pre visit review using our clinic review tool, if applicable. No additional management support is needed unless otherwise documented below in the visit note. 

## 2016-06-09 NOTE — Progress Notes (Signed)
Chief Complaint  Patient presents with  . Nasal Congestion    12 days  . Cough    [name] here for URI complaints.  Duration: 12 days  Associated symptoms: fever, shakes, cough, SOB, nasal congestion, rhinorrhea, sore throat, sinus pain/pressure, L ear pain Denies: itchy watery eyes, wheezing and myalgia Treatment to date: Psudafed Sick contacts: Yes- wife  ROS:  Const: +fevers HEENT: As noted in HPI Lungs: +SOB  Past Medical History:  Diagnosis Date  . Hyperlipidemia   . Hypertension   . RA (rheumatoid arthritis) ([facility])    Family History  Problem Relation Age of Onset  . Cancer Father     Oral --angioplasty expired from cancer  . Diabetes Father   . Dementia Mother     BP 112/68 (BP Location: Left Arm, Patient Position: Sitting, Cuff Size: Normal)   Pulse (!) 103   Temp (!) 101.6 F (38.7 C) (Oral)   Ht 5\' 8"  (1.727 m)   Wt 181 lb 4 oz (82.2 kg)   SpO2 94%   BMI 27.56 kg/m  General: Awake, alert, appears stated age HEENT: AT, , ears patent b/l and TM's neg, nares patent w/o discharge, +max sinus TTP, pharynx pink and without exudates, MMM Neck: No masses or asymmetry Heart: RRR, no murmurs, no bruits Lungs: CTAB, no accessory muscle use Psych: Age appropriate judgment and insight, normal mood and affect  Acute non-recurrent maxillary sinusitis - Plan: doxycycline (VIBRA-TABS) 100 MG tablet  Orders as above. Given fevers and rigors, bacterial infection much more likely.  Doxy should cover any bacterial bronchitis that may be lingering as well.  Continue to push fluids, practice good hand hygiene, cover mouth when coughing. F/u prn, ER/UC if symptoms worsen or if breathing difficulties. Pt voiced understanding and agreement to the plan.  [place], DO 06/09/16 8:40 AM

## 2016-06-14 ENCOUNTER — Telehealth: Payer: Self-pay | Admitting: Family Medicine

## 2016-06-14 NOTE — Telephone Encounter (Signed)
Pt called in. He said that he was seen by Dr Nani Ravens and prescribed a medication. Pt says that he is taking medication but isn't feeling any better. Pt would like to be advised further.

## 2016-06-15 NOTE — Telephone Encounter (Signed)
Pt was last seen 06/09/16 and prescribed Doxycycline. Please advise. TL/CMA

## 2016-06-15 NOTE — Telephone Encounter (Signed)
I have scheduled patient on 06/16/2016. Please advise. TL/CMA

## 2016-06-15 NOTE — Telephone Encounter (Signed)
If he is no better at all, needs to be seen again. If it's just a cough, this can last 4-6 weeks after illness. TY.

## 2016-06-16 ENCOUNTER — Ambulatory Visit (INDEPENDENT_AMBULATORY_CARE_PROVIDER_SITE_OTHER): Payer: BLUE CROSS/BLUE SHIELD | Admitting: Family Medicine

## 2016-06-16 ENCOUNTER — Other Ambulatory Visit (INDEPENDENT_AMBULATORY_CARE_PROVIDER_SITE_OTHER): Payer: BLUE CROSS/BLUE SHIELD

## 2016-06-16 ENCOUNTER — Encounter: Payer: Self-pay | Admitting: Family Medicine

## 2016-06-16 VITALS — BP 127/85 | HR 66 | Temp 98.5°F | Ht 68.0 in | Wt 179.4 lb

## 2016-06-16 DIAGNOSIS — R739 Hyperglycemia, unspecified: Secondary | ICD-10-CM

## 2016-06-16 DIAGNOSIS — J0100 Acute maxillary sinusitis, unspecified: Secondary | ICD-10-CM

## 2016-06-16 LAB — HEMOGLOBIN A1C: HEMOGLOBIN A1C: 5.8 % (ref 4.6–6.5)

## 2016-06-16 MED ORDER — FLUTICASONE PROPIONATE 50 MCG/ACT NA SUSP: 2.0000 | Freq: Every day | NASAL | 6 refills | Status: DC

## 2016-06-16 NOTE — Patient Instructions (Signed)
Finish your antibiotic course.  Claritin (loratadine), Allegra (fexofenadine), Zyrtec (cetirizine); these are listed in order from weakest to strongest. Generic, and therefore cheaper, options are in the parentheses.   Flonase (fluticasone); nasal spray that is over the counter. 2 sprays each nostril, once daily. Aim towards the same side eye when you spray.  There are available OTC, and the generic versions, which may be cheaper, are in parentheses. Show this to a pharmacist if you have trouble finding any of these items.  If you start to get worse or are not better in 1 week, let me know. Just call, you don't need an appointment.

## 2016-06-16 NOTE — Progress Notes (Signed)
Chief Complaint  Patient presents with  . Follow-up    on sinus infection    Timothy Huynh here for sinusitis f/u.  Seen 1 week ago, was febrile, started on doxy. After 24 hours, fever had resolved. Associated symptoms: sinus congestion, sinus pain, ear pain and cough Denies: fever, myalgia, wheezing, ear drainage Treatment to date: Doxycycline Sick contacts: No  ROS:  Const: Denies fevers HEENT: As noted in HPI Lungs: No SOB  Past Medical History:  Diagnosis Date  . Hyperlipidemia   . Hypertension   . RA (rheumatoid arthritis) ([facility])    Family History  Problem Relation Age of Onset  . Cancer Father     Oral --angioplasty expired from cancer  . Diabetes Father   . Dementia Mother     BP 127/85 (BP Location: Left Arm, Patient Position: Sitting, Cuff Size: Large)   Pulse 66   Temp 98.5 F (36.9 C) (Oral)   Ht 5\' 8"  (1.727 m)   Wt 179 lb 6.4 oz (81.4 kg)   SpO2 100%   BMI 27.28 kg/m  General: Awake, alert, appears stated age HEENT: AT, [place], ears patent b/l and R TM neg, L TM without erythema, fluid present, nares patent w/o discharge, mild TTP over L max sinus, pharynx pink and without exudates, MMM Neck: No masses or asymmetry Heart: RRR, no murmurs, no bruits Lungs: CTAB, no accessory muscle use Psych: Age appropriate judgment and insight, normal mood and affect  Subacute maxillary sinusitis - Plan: fluticasone (FLONASE) 50 MCG/ACT nasal spray  Orders as above. Finish abx. This needs more time, encouraging that fever has resolved after being on abx for 24 hours. He is continuing to improve. Continue to push fluids, practice good hand hygiene, cover mouth when coughing. F/u in 1 week if symptoms worsen or fail to improve. Pt voiced understanding and agreement to the plan.  [name], DO 06/16/16 9:32 AM

## 2016-06-20 ENCOUNTER — Encounter: Payer: Self-pay | Admitting: *Deleted

## 2016-06-20 NOTE — Progress Notes (Signed)
Normal Results; Letter mailed/SLS 02/05

## 2017-02-20 ENCOUNTER — Telehealth (INDEPENDENT_AMBULATORY_CARE_PROVIDER_SITE_OTHER): Payer: Self-pay | Admitting: Specialist

## 2017-02-20 NOTE — Telephone Encounter (Signed)
Patient advised he hurt his back again over the weekend and want to know if Dr Louanne Skye will prescribe Prednisone and Hydrocodone. The number to contact patient is [phone]

## 2017-02-21 ENCOUNTER — Ambulatory Visit (INDEPENDENT_AMBULATORY_CARE_PROVIDER_SITE_OTHER): Payer: BLUE CROSS/BLUE SHIELD | Admitting: Specialist

## 2017-02-21 ENCOUNTER — Encounter (INDEPENDENT_AMBULATORY_CARE_PROVIDER_SITE_OTHER): Payer: Self-pay | Admitting: Specialist

## 2017-02-21 ENCOUNTER — Ambulatory Visit (INDEPENDENT_AMBULATORY_CARE_PROVIDER_SITE_OTHER): Payer: BLUE CROSS/BLUE SHIELD

## 2017-02-21 VITALS — BP 163/81 | HR 56 | Ht 67.0 in | Wt 165.0 lb

## 2017-02-21 DIAGNOSIS — M47816 Spondylosis without myelopathy or radiculopathy, lumbar region: Secondary | ICD-10-CM

## 2017-02-21 DIAGNOSIS — S3992XA Unspecified injury of lower back, initial encounter: Secondary | ICD-10-CM

## 2017-02-21 DIAGNOSIS — M5136 Other intervertebral disc degeneration, lumbar region: Secondary | ICD-10-CM

## 2017-02-21 MED ORDER — METHYLPREDNISOLONE 4 MG PO TBPK: ORAL_TABLET | ORAL | 0 refills | Status: DC

## 2017-02-21 MED ORDER — METHOCARBAMOL 500 MG PO TABS: 500.0000 mg | ORAL_TABLET | Freq: Three times a day (TID) | ORAL | 0 refills | Status: DC | PRN

## 2017-02-21 MED ORDER — HYDROCODONE-ACETAMINOPHEN 10-325 MG PO TABS: 1.0000 | ORAL_TABLET | ORAL | 0 refills | Status: DC | PRN

## 2017-02-21 NOTE — Patient Instructions (Signed)
Avoid bending, stooping and avoid lifting weights greater than 10 lbs. Avoid prolong standing and walking. Avoid frequent bending and stooping  No lifting greater than 10 lbs. May use ice or moist heat for pain. Weight loss is of benefit.

## 2017-02-21 NOTE — Progress Notes (Signed)
Office Visit Note   Patient: Timothy Huynh           Date of Birth: 1955/06/11           MRN: [id number] Visit Date: 02/21/2017              Requested by: [address], [place], [place] [address] [place],  [id number] PCP: Carollee Herter, Alferd Apa, DO   Assessment & Plan: Visit Diagnoses:  1. Injury of back, initial encounter   2. Degenerative disc disease, lumbar   3. Spondylosis without myelopathy or radiculopathy, lumbar region     Plan: Avoid bending, stooping and avoid lifting weights greater than 10 lbs. Avoid prolong standing and walking. Avoid frequent bending and stooping  No lifting greater than 10 lbs. May use ice or moist heat for pain. Weight loss is of benefit.  Follow-Up Instructions: Return in about 1 week (around 02/28/2017).   Orders:  Orders Placed This Encounter  Procedures  . XR Lumbar Spine 2-3 Views   No orders of the defined types were placed in this encounter.     Procedures: No procedures performed   Clinical Data: No additional findings.   Subjective: Chief Complaint  Patient presents with  . Lower Back - Pain    Injured when he was sitting and bent over to tie his shoe    61 year old male with history of recurring back pain and leg pain last seen in 12/2014 when he had a severe episode of back and leg pain. Considered an ESI  But the pain improved with medrol dose pak and hydrocodone. He eventually went to a PT program and has lost weight. He continues to golf several times 2-3 times per week. The last game was about 2 weeks ago and then  He played 9 holes after work on Friday. No bowel or bladder difficulties. He has difficulty with standing and walking, yesterday he was hunched over and he is today also.    Review of Systems  Constitutional: Negative.   HENT: Negative.   Eyes: Negative.   Respiratory: Negative.  Negative for chest tightness, shortness of breath and wheezing.   Cardiovascular: Negative for chest  pain, palpitations and leg swelling.  Gastrointestinal: Negative.  Negative for abdominal distention, abdominal pain, anal bleeding, blood in stool, constipation, diarrhea, nausea, rectal pain and vomiting.  Endocrine: Negative for cold intolerance and heat intolerance.  Genitourinary: Negative.  Negative for difficulty urinating, dysuria, enuresis, flank pain, frequency, genital sores and hematuria.  Musculoskeletal: Positive for arthralgias, back pain, gait problem and joint swelling.  Skin: Negative.  Negative for color change, pallor, rash and wound.  Allergic/Immunologic: Negative.   Neurological: Negative for tremors, seizures, syncope, facial asymmetry, speech difficulty, weakness, light-headedness, numbness and headaches.  Hematological: Negative.   Psychiatric/Behavioral: Negative.  Negative for agitation, behavioral problems, confusion, decreased concentration, dysphoric mood, hallucinations, self-injury, sleep disturbance and suicidal ideas. The patient is not nervous/anxious and is not hyperactive.      Objective: Vital Signs: BP (!) 163/81 (BP Location: Left Arm, Patient Position: Sitting)   Pulse (!) 56   Ht 5\' 7"  (1.702 m)   Wt 165 lb (74.8 kg)   BMI 25.84 kg/m   Physical Exam  Constitutional: He is oriented to person, place, and time. He appears well-developed and well-nourished. No distress.  HENT:  Head: Normocephalic and atraumatic.  Eyes: Pupils are equal, round, and reactive to light. EOM are normal. Right eye exhibits no discharge.  Left eye exhibits no discharge. No scleral icterus.  Neck: Normal range of motion. Neck supple. No JVD present. No tracheal deviation present. No thyromegaly present.  Cardiovascular: Exam reveals no gallop and no friction rub.   No murmur heard. Pulmonary/Chest: Effort normal and breath sounds normal. No respiratory distress. He has no wheezes. He has no rales. He exhibits no tenderness.  Abdominal: Soft. Bowel sounds are normal. He  exhibits no distension. There is no tenderness. There is no guarding.  Musculoskeletal: He exhibits tenderness. He exhibits no edema or deformity.  Lymphadenopathy:    He has no cervical adenopathy.  Neurological: He is alert and oriented to person, place, and time. He displays normal reflexes. No cranial nerve deficit or sensory deficit. He exhibits normal muscle tone. Coordination normal.  Skin: Skin is warm and dry. No rash noted. He is not diaphoretic. No erythema. No pallor.  Psychiatric: He has a normal mood and affect. His behavior is normal. Judgment and thought content normal.    Back Exam   Tenderness  The patient is experiencing tenderness in the lumbar.  Range of Motion  Extension: abnormal  Flexion: normal  Lateral Bend Right: abnormal  Lateral Bend Left: abnormal  Rotation Right: abnormal  Rotation Left: abnormal   Muscle Strength  Right Quadriceps:  5/5  Left Quadriceps:  5/5  Right Hamstrings:  5/5  Left Hamstrings:  5/5   Reflexes  Patellar: normal Achilles: normal Babinski's sign: normal   Other  Toe Walk: normal Heel Walk: normal Sensation: normal      Specialty Comments:  No specialty comments available.  Imaging: No results found.   PMFS History: Patient Active Problem List   Diagnosis Date Noted  . Rheumatoid arthritis involving multiple sites with positive rheumatoid factor ([place]) 03/14/2016  . Preventative health care 03/14/2016  . Multiple joint pain 02/11/2013  . Sinusitis 05/24/2012  . ALLERGY UNSPECIFIED NOT ELSEWHERE CLASSIFIED 03/03/2010  . NEVI, MULTIPLE 02/17/2010  . ERECTILE DYSFUNCTION, NON-ORGANIC 02/17/2010  . LOW BACK PAIN, ACUTE 12/14/2009  . SKIN RASH 12/22/2008  . HYPERLIPIDEMIA 08/29/2008  . ELEVATED BLOOD PRESSURE WITHOUT DIAGNOSIS OF HYPERTENSION 09/18/2007  . SYNCOPE 09/11/2007  . URTICARIA 07/06/2007  . HERNIORRHAPHY, HX OF 07/06/2007   Past Medical History:  Diagnosis Date  . Hyperlipidemia   .  Hypertension   . RA (rheumatoid arthritis) ([facility])     Family History  Problem Relation Age of Onset  . Cancer Father        Oral --angioplasty expired from cancer  . Diabetes Father   . Dementia Mother     Past Surgical History:  Procedure Laterality Date  . HERNIA REPAIR     Social History   Occupational History  . TE Engineer, [employer]   Social History Main Topics  . Smoking status: Never Smoker  . Smokeless tobacco: Never Used  . Alcohol use Yes  . Drug use: No  . Sexual activity: Yes    Partners: Female

## 2017-02-21 NOTE — Telephone Encounter (Signed)
Patient needs to be seen before meds can be rx'd--patient worked in at [employer] 02/21/17

## 2017-02-22 ENCOUNTER — Telehealth (INDEPENDENT_AMBULATORY_CARE_PROVIDER_SITE_OTHER): Payer: Self-pay

## 2017-02-22 NOTE — Telephone Encounter (Signed)
Patient called stating that [facility] is needing clarification on Rx for Methocarbamol.  Cb# for patient is [phone].  Please advise.  Thank You.

## 2017-02-22 NOTE — Telephone Encounter (Signed)
I called and spoke with patient, he states that his pharm does not have the methocarbamol and he would like to know if there is a different medication that can be rx'd

## 2017-02-23 ENCOUNTER — Other Ambulatory Visit (INDEPENDENT_AMBULATORY_CARE_PROVIDER_SITE_OTHER): Payer: Self-pay | Admitting: Specialist

## 2017-02-23 MED ORDER — TIZANIDINE HCL 2 MG PO TABS: 2.0000 mg | ORAL_TABLET | Freq: Four times a day (QID) | ORAL | 0 refills | Status: DC | PRN

## 2017-02-23 NOTE — Telephone Encounter (Signed)
[facility]

## 2017-02-23 NOTE — Telephone Encounter (Signed)
What is his insurance and I can look up the formulary and determine if there is a different medication that [employer] prefers. jen

## 2017-02-24 ENCOUNTER — Telehealth (INDEPENDENT_AMBULATORY_CARE_PROVIDER_SITE_OTHER): Payer: Self-pay | Admitting: Specialist

## 2017-02-24 NOTE — Telephone Encounter (Signed)
Patient called stating that Dr. Louanne Skye called in a medication that was not carried at his local pharmacy.  [facility].  [facility] needs [facility] prescription [facility] [address] [facility].  He has been waiting for this medication since Tuesday.  GS#[phone]

## 2017-02-24 NOTE — Telephone Encounter (Signed)
Called rx to [place], patient is aware

## 2017-02-28 ENCOUNTER — Other Ambulatory Visit (INDEPENDENT_AMBULATORY_CARE_PROVIDER_SITE_OTHER): Payer: Self-pay | Admitting: Specialist

## 2017-02-28 ENCOUNTER — Ambulatory Visit (INDEPENDENT_AMBULATORY_CARE_PROVIDER_SITE_OTHER): Payer: BLUE CROSS/BLUE SHIELD | Admitting: Specialist

## 2017-02-28 ENCOUNTER — Encounter (INDEPENDENT_AMBULATORY_CARE_PROVIDER_SITE_OTHER): Payer: Self-pay

## 2017-02-28 MED ORDER — BACLOFEN 10 MG PO TABS: 10.0000 mg | ORAL_TABLET | Freq: Three times a day (TID) | ORAL | 0 refills | Status: DC

## 2017-02-28 NOTE — Telephone Encounter (Signed)
Rx fpr baclofen sent to his pharmacy.

## 2017-03-16 ENCOUNTER — Ambulatory Visit (INDEPENDENT_AMBULATORY_CARE_PROVIDER_SITE_OTHER): Payer: BLUE CROSS/BLUE SHIELD | Admitting: Family Medicine

## 2017-03-16 ENCOUNTER — Encounter: Payer: Self-pay | Admitting: Family Medicine

## 2017-03-16 VITALS — BP 131/70 | HR 55 | Temp 98.2°F | Resp 16 | Ht 67.0 in | Wt 172.0 lb

## 2017-03-16 DIAGNOSIS — G8929 Other chronic pain: Secondary | ICD-10-CM

## 2017-03-16 DIAGNOSIS — M545 Low back pain, unspecified: Secondary | ICD-10-CM

## 2017-03-16 DIAGNOSIS — E785 Hyperlipidemia, unspecified: Secondary | ICD-10-CM | POA: Diagnosis not present

## 2017-03-16 DIAGNOSIS — Z23 Encounter for immunization: Secondary | ICD-10-CM | POA: Diagnosis not present

## 2017-03-16 DIAGNOSIS — Z0001 Encounter for general adult medical examination with abnormal findings: Secondary | ICD-10-CM

## 2017-03-16 DIAGNOSIS — Z0000 Encounter for general adult medical examination without abnormal findings: Secondary | ICD-10-CM | POA: Diagnosis not present

## 2017-03-16 LAB — CBC WITH DIFFERENTIAL/PLATELET
BASOS ABS: 0 10*3/uL (ref 0.0–0.1)
Basophils Relative: 0.3 % (ref 0.0–3.0)
EOS ABS: 0.2 10*3/uL (ref 0.0–0.7)
Eosinophils Relative: 3.2 % (ref 0.0–5.0)
HEMATOCRIT: 43.5 % (ref 39.0–52.0)
Hemoglobin: 14.6 g/dL (ref 13.0–17.0)
LYMPHS ABS: 1.3 10*3/uL (ref 0.7–4.0)
LYMPHS PCT: 26.8 % (ref 12.0–46.0)
MCHC: 33.6 g/dL (ref 30.0–36.0)
MCV: 95.7 fl (ref 78.0–100.0)
MONOS PCT: 9.2 % (ref 3.0–12.0)
Monocytes Absolute: 0.4 10*3/uL (ref 0.1–1.0)
NEUTROS ABS: 2.9 10*3/uL (ref 1.4–7.7)
Neutrophils Relative %: 60.5 % (ref 43.0–77.0)
PLATELETS: 315 10*3/uL (ref 150.0–400.0)
RBC: 4.54 Mil/uL (ref 4.22–5.81)
RDW: 13.5 % (ref 11.5–15.5)
WBC: 4.8 10*3/uL (ref 4.0–10.5)

## 2017-03-16 LAB — COMPREHENSIVE METABOLIC PANEL
ALT: 30 U/L (ref 0–53)
AST: 13 U/L (ref 0–37)
Albumin: 4.6 g/dL (ref 3.5–5.2)
Alkaline Phosphatase: 64 U/L (ref 39–117)
BUN: 10 mg/dL (ref 6–23)
CALCIUM: 9.5 mg/dL (ref 8.4–10.5)
CHLORIDE: 100 meq/L (ref 96–112)
CO2: 31 meq/L (ref 19–32)
Creatinine, Ser: 0.81 mg/dL (ref 0.40–1.50)
GFR: 103.2 mL/min (ref >60.00)
GLUCOSE: 97 mg/dL (ref 70–99)
Potassium: 4.4 mEq/L (ref 3.5–5.1)
Sodium: 137 mEq/L (ref 135–145)
Total Bilirubin: 0.8 mg/dL (ref 0.2–1.2)
Total Protein: 7.1 g/dL (ref 6.0–8.3)

## 2017-03-16 LAB — PSA: PSA: 1.94 ng/mL (ref 0.10–4.00)

## 2017-03-16 LAB — POC URINALSYSI DIPSTICK (AUTOMATED)
Bilirubin, UA: NEGATIVE
GLUCOSE UA: NEGATIVE
KETONES UA: NEGATIVE
Leukocytes, UA: NEGATIVE
Nitrite, UA: NEGATIVE
Protein, UA: NEGATIVE
RBC UA: NEGATIVE
SPEC GRAV UA: 1.015 (ref 1.010–1.025)
Urobilinogen, UA: 0.2 E.U./dL
pH, UA: 6 (ref 5.0–8.0)

## 2017-03-16 LAB — LIPID PANEL
CHOL/HDL RATIO: 4
Cholesterol: 207 mg/dL — ABNORMAL HIGH (ref 0–200)
HDL: 51.5 mg/dL (ref >39.00)
LDL Cholesterol: 138 mg/dL — ABNORMAL HIGH (ref 0–99)
NONHDL: 155.59
Triglycerides: 90 mg/dL (ref 0.0–149.0)
VLDL: 18 mg/dL (ref 0.0–40.0)

## 2017-03-16 LAB — TSH: TSH: 2.46 u[IU]/mL (ref 0.35–4.50)

## 2017-03-16 NOTE — Assessment & Plan Note (Signed)
ghm utd Check labs See Avs shingrix given Flu shot given

## 2017-03-16 NOTE — Assessment & Plan Note (Signed)
Encouraged heart healthy diet, increase exercise, avoid trans fats, consider a krill oil cap daily 

## 2017-03-16 NOTE — Patient Instructions (Signed)

## 2017-03-16 NOTE — Assessment & Plan Note (Signed)
Per ortho--- pt may want to see neuro surgery He may need new mri first

## 2017-03-16 NOTE — Progress Notes (Signed)
Patient ID: Timothy Huynh, male   DOB: [birth year], 61 y.o.   MRN: [id number]    Subjective:  I acted as a Education administrator for Dr. Carollee Herter.  Timothy Huynh, [place]   Patient ID: Timothy Huynh, male    DOB: 04-05-1956, 61 y.o.   MRN: [id number]  Chief Complaint  Patient presents with  . 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 Hyperlipidemia   . Hypertension   . RA (rheumatoid arthritis) ([place])     Past Surgical History:  Procedure Laterality Date  . HERNIA REPAIR      Family History  Problem Relation Age of Onset  . Cancer Father        Oral --angioplasty expired from cancer  . Diabetes Father   . Dementia Mother     Social History   Social History  . Marital status: Married    Spouse name: N/A  . Number of children: N/A  . Years of education: N/A   Occupational History  . TE Engineer, petroleum   Social History Main Topics  . Smoking status: Never Smoker  . Smokeless tobacco: Never Used  . Alcohol use Yes  . Drug use: No  . Sexual activity: Yes    Partners: Female   Other Topics Concern  . Not on file   Social History Narrative   Exercise--- 2-3 x a week    Outpatient Medications Prior to Visit  Medication Sig Dispense Refill  . folic acid (FOLVITE) 1 MG tablet Take 1 mg by mouth daily.    . methotrexate (RHEUMATREX) 2.5 MG tablet Take 10 mg by mouth every Monday.    . baclofen (LIORESAL) 10 MG tablet Take 1 tablet (10 mg total) by mouth 3 (three) times daily. 30 each 0  . doxycycline (VIBRA-TABS) 100 MG tablet Take 1 tablet (100 mg total) by mouth 2 (two) times daily. 20 tablet 0  . fluticasone (FLONASE) 50 MCG/ACT nasal spray Place 2 sprays into both nostrils daily. 16 g 6  . HYDROcodone-acetaminophen (NORCO) 10-325 MG tablet Take 1 tablet by mouth every 4 (four) hours as needed for moderate pain. 40 tablet 0  . methylPREDNISolone (MEDROL  DOSEPAK) 4 MG TBPK tablet Take as directed. 21 tablet 0  . tiZANidine (ZANAFLEX) 2 MG tablet Take 1 tablet (2 mg total) by mouth every 6 (six) hours as needed for muscle spasms. 30 tablet 0   No facility-administered medications prior to visit.     No Known Allergies  Review of Systems  Constitutional: Negative for fever and malaise/fatigue.  HENT: Negative for congestion.   Eyes: Negative for blurred vision.  Respiratory: Negative for cough and shortness of breath.   Cardiovascular: Negative for chest pain, palpitations and leg swelling.  Gastrointestinal: Negative for abdominal pain, blood in stool, nausea and vomiting.  Genitourinary: Negative for dysuria and frequency.  Musculoskeletal: Negative for back pain and falls.  Skin: Negative for rash.  Neurological: Negative for dizziness, loss of consciousness and headaches.  Endo/Heme/Allergies: Negative for environmental allergies.  Psychiatric/Behavioral: Negative for depression. The patient is not nervous/anxious.        Objective:    Physical Exam  Constitutional: He is oriented to person, place, and time. Vital signs are normal. He appears well-developed and well-nourished. He is sleeping. No distress.  HENT:  Head: Normocephalic and atraumatic.  Right Ear: External  ear normal.  Left Ear: External ear normal.  Nose: Nose normal.  Mouth/Throat: Oropharynx is clear and moist. No oropharyngeal exudate.  Eyes: Pupils are equal, round, and reactive to light. Conjunctivae and EOM are normal. Right eye exhibits no discharge. Left eye exhibits no discharge.  Neck: Normal range of motion. Neck supple. No JVD present. No thyromegaly present.  Cardiovascular: Normal rate, regular rhythm and intact distal pulses.  Exam reveals no gallop and no friction rub.   No murmur heard. Pulmonary/Chest: Effort normal and breath sounds normal. No respiratory distress. He has no wheezes. He has no rales. He exhibits no tenderness.  Abdominal:  Soft. Bowel sounds are normal. He exhibits no distension and no mass. There is no tenderness. There is no rebound and no guarding.  Genitourinary: Rectum normal, prostate normal and penis normal. Rectal exam shows guaiac negative stool.  Musculoskeletal: Normal range of motion. He exhibits no edema or tenderness.  Lymphadenopathy:    He has no cervical adenopathy.  Neurological: He is alert and oriented to person, place, and time. He displays normal reflexes. He exhibits normal muscle tone.  Skin: Skin is warm and dry. No rash noted. He is not diaphoretic. No erythema. No pallor.  Psychiatric: He has a normal mood and affect. His behavior is normal. Judgment and thought content normal.  Nursing note and vitals reviewed.   BP 131/70   Pulse (!) 55   Temp 98.2 F (36.8 C) (Oral)   Resp 16   Ht 5\' 7"  (1.702 m)   Wt 172 lb (78 kg)   SpO2 100%   BMI 26.94 kg/m  Wt Readings from Last 3 Encounters:  03/16/17 172 lb (78 kg)  02/21/17 165 lb (74.8 kg)  06/16/16 179 lb 6.4 oz (81.4 kg)     Lab Results  Component Value Date   WBC 5.7 03/18/2016   HGB 14.9 03/18/2016   HCT 43.1 03/18/2016   PLT 363.0 03/18/2016   GLUCOSE 106 (H) 03/18/2016   CHOL 192 03/14/2016   TRIG (H) 03/14/2016    432.0 Triglyceride is over 400; calculations on Lipids are invalid.   HDL 39.00 (L) 03/14/2016   LDLDIRECT 123.0 03/14/2016   LDLCALC 129 (H) 03/05/2015   ALT 28 03/18/2016   AST 11 03/18/2016   NA 138 03/18/2016   K 4.1 03/18/2016   CL 101 03/18/2016   CREATININE 0.96 03/18/2016   BUN 12 03/18/2016   CO2 30 03/18/2016   TSH 3.27 03/14/2016   PSA 1.33 03/14/2016   INR 1.0 09/11/2007   HGBA1C 5.8 06/16/2016   MICROALBUR 0.7 09/27/2012    Lab Results  Component Value Date   TSH 3.27 03/14/2016   Lab Results  Component Value Date   WBC 5.7 03/18/2016   HGB 14.9 03/18/2016   HCT 43.1 03/18/2016   MCV 92.2 03/18/2016   PLT 363.0 03/18/2016   Lab Results  Component Value Date   NA  138 03/18/2016   K 4.1 03/18/2016   CO2 30 03/18/2016   GLUCOSE 106 (H) 03/18/2016   BUN 12 03/18/2016   CREATININE 0.96 03/18/2016   BILITOT 0.5 03/18/2016   ALKPHOS 61 03/18/2016   AST 11 03/18/2016   ALT 28 03/18/2016   PROT 7.3 03/18/2016   ALBUMIN 4.7 03/18/2016   CALCIUM 9.5 03/18/2016   ANIONGAP 10 03/16/2016   GFR 85.12 03/18/2016   Lab Results  Component Value Date   CHOL 192 03/14/2016   Lab Results  Component Value Date  HDL 39.00 (L) 03/14/2016   Lab Results  Component Value Date   LDLCALC 129 (H) 03/05/2015   Lab Results  Component Value Date   TRIG (H) 03/14/2016    432.0 Triglyceride is over 400; calculations on Lipids are invalid.   Lab Results  Component Value Date   CHOLHDL 5 03/14/2016   Lab Results  Component Value Date   HGBA1C 5.8 06/16/2016       Assessment & Plan:   Problem List Items Addressed This Visit      Unprioritized   Hyperlipidemia LDL goal <100    Encouraged heart healthy diet, increase exercise, avoid trans fats, consider a krill oil cap daily      Relevant Orders   CBC with Differential/Platelet   Comprehensive metabolic panel   Lipid panel   PSA   TSH   POCT Urinalysis Dipstick (Automated) (Completed)   LOW BACK PAIN, ACUTE    Per ortho--- pt may want to see neuro surgery He may need new mri first      Preventative health care - Primary    ghm utd Check labs See Avs shingrix given Flu shot given       Relevant Orders   CBC with Differential/Platelet   Comprehensive metabolic panel   Lipid panel   PSA   TSH   POCT Urinalysis Dipstick (Automated) (Completed)   PSA      I have discontinued Mr. Aguinaldo doxycycline, fluticasone, HYDROcodone-acetaminophen, methylPREDNISolone, tiZANidine, and baclofen. I am also having him maintain his folic acid and methotrexate.  No orders of the defined types were placed in this encounter.   CMA served as Education administrator during this visit. History, Physical and Plan  performed by medical provider. Documentation and orders reviewed and attested to.  Ann Held, DO

## 2017-03-17 NOTE — Addendum Note (Signed)
Addended by: Kem Boroughs D on: 03/17/2017 01:07 PM   Modules accepted: Orders

## 2017-03-21 ENCOUNTER — Other Ambulatory Visit: Payer: Self-pay | Admitting: Family Medicine

## 2017-03-21 MED ORDER — ATORVASTATIN CALCIUM 20 MG PO TABS: 20.0000 mg | ORAL_TABLET | Freq: Every day | ORAL | 2 refills | Status: DC

## 2017-03-27 IMAGING — CR DG CHEST 2V
2 series · 2 of 2 positions shown · non-contrast
Comparison: None.

CLINICAL DATA: Acute onset of dizziness, nausea, vomiting,
bilateral hand tingling and shortness of breath. Initial encounter.

EXAM:
CHEST  2 VIEW

[chest pa]
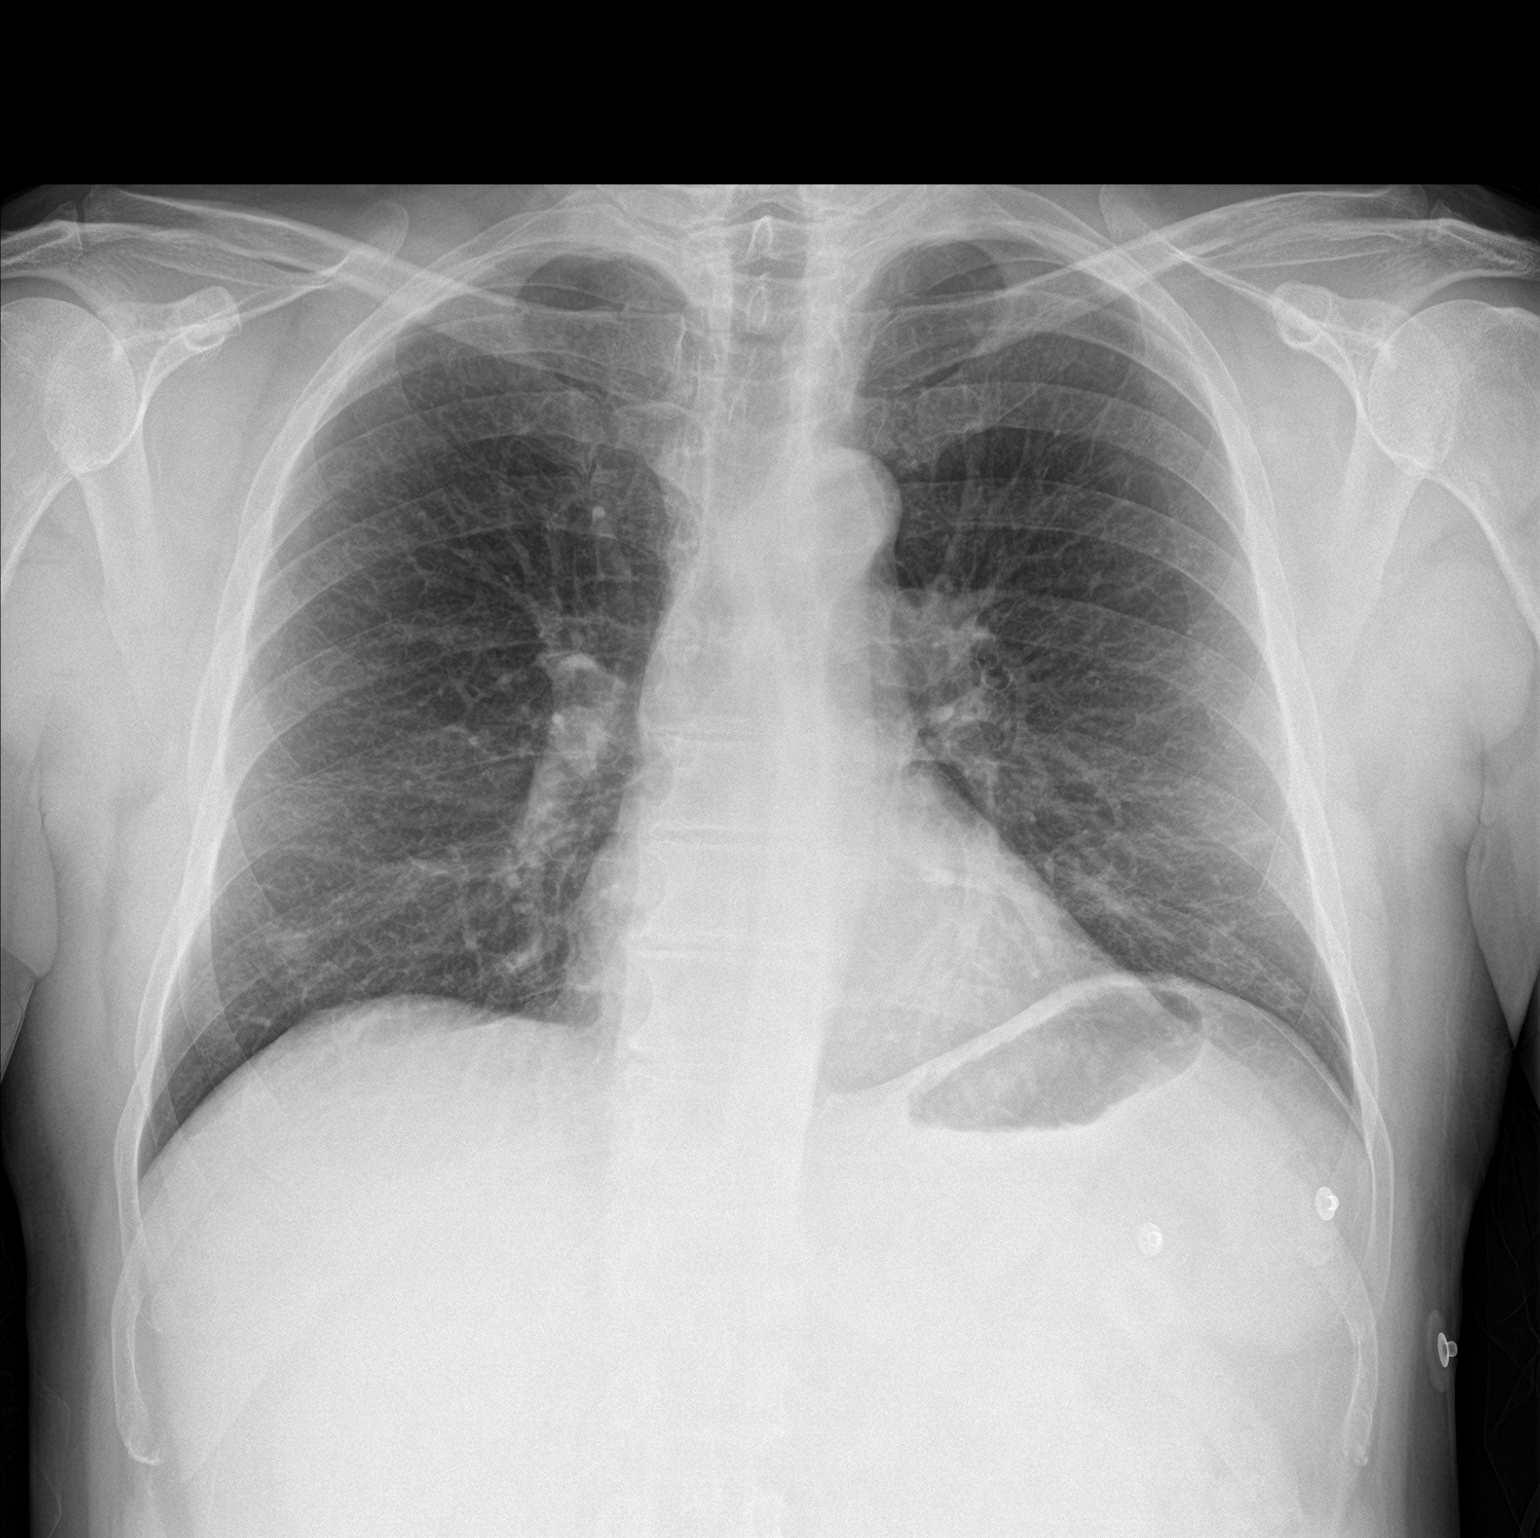

[chest lat]
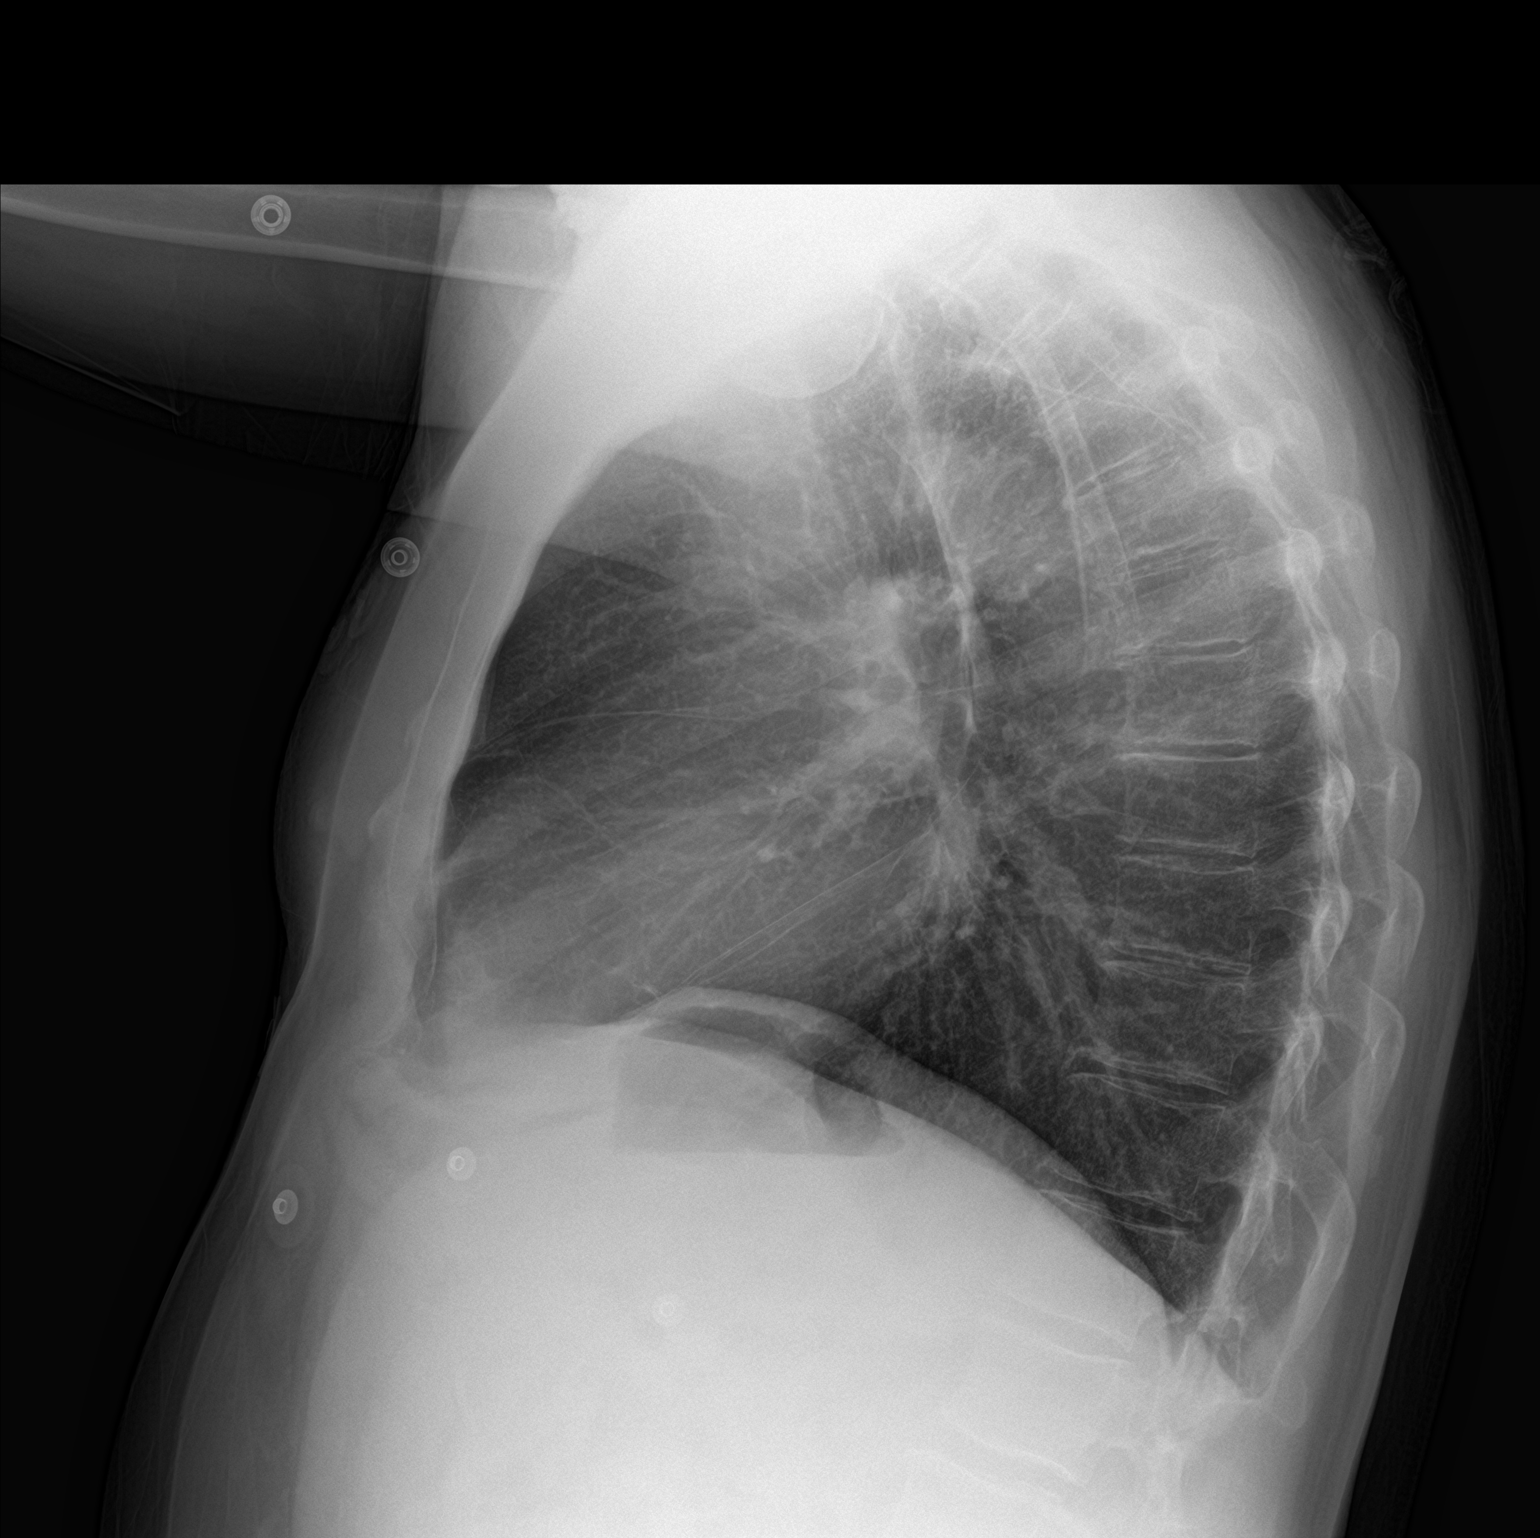

[2 of 2 positions shown; findings below may reference images not displayed]

FINDINGS: The lungs are well-aerated. Peribronchial thickening is noted. Mild
bibasilar atelectasis is seen. There is no evidence of pleural
effusion or pneumothorax.

The heart is normal in size; the mediastinal contour is within
normal limits. No acute osseous abnormalities are seen.
IMPRESSION: Peribronchial thickening noted.  Mild bibasilar atelectasis seen.

## 2017-05-25 ENCOUNTER — Ambulatory Visit (INDEPENDENT_AMBULATORY_CARE_PROVIDER_SITE_OTHER): Payer: BLUE CROSS/BLUE SHIELD

## 2017-05-25 DIAGNOSIS — Z23 Encounter for immunization: Secondary | ICD-10-CM | POA: Diagnosis not present

## 2017-06-19 DIAGNOSIS — Z79899 Other long term (current) drug therapy: Secondary | ICD-10-CM | POA: Diagnosis not present

## 2017-06-19 DIAGNOSIS — M7701 Medial epicondylitis, right elbow: Secondary | ICD-10-CM | POA: Diagnosis not present

## 2017-06-19 DIAGNOSIS — M0609 Rheumatoid arthritis without rheumatoid factor, multiple sites: Secondary | ICD-10-CM | POA: Diagnosis not present

## 2017-07-11 ENCOUNTER — Other Ambulatory Visit: Payer: Self-pay | Admitting: Family Medicine

## 2017-07-30 ENCOUNTER — Encounter: Payer: Self-pay | Admitting: Gastroenterology

## 2017-10-23 DIAGNOSIS — M7701 Medial epicondylitis, right elbow: Secondary | ICD-10-CM | POA: Diagnosis not present

## 2017-10-23 DIAGNOSIS — Z79899 Other long term (current) drug therapy: Secondary | ICD-10-CM | POA: Diagnosis not present

## 2017-10-23 DIAGNOSIS — M0609 Rheumatoid arthritis without rheumatoid factor, multiple sites: Secondary | ICD-10-CM | POA: Diagnosis not present

## 2018-01-20 ENCOUNTER — Other Ambulatory Visit: Payer: Self-pay | Admitting: Family Medicine

## 2018-03-20 ENCOUNTER — Encounter: Payer: Self-pay | Admitting: Family Medicine

## 2018-03-20 ENCOUNTER — Ambulatory Visit (INDEPENDENT_AMBULATORY_CARE_PROVIDER_SITE_OTHER): Payer: BLUE CROSS/BLUE SHIELD | Admitting: Family Medicine

## 2018-03-20 VITALS — BP 118/70 | HR 55 | Temp 98.1°F | Resp 16 | Ht 67.0 in | Wt 176.8 lb

## 2018-03-20 DIAGNOSIS — Z23 Encounter for immunization: Secondary | ICD-10-CM | POA: Diagnosis not present

## 2018-03-20 DIAGNOSIS — Z0000 Encounter for general adult medical examination without abnormal findings: Secondary | ICD-10-CM

## 2018-03-20 DIAGNOSIS — M0579 Rheumatoid arthritis with rheumatoid factor of multiple sites without organ or systems involvement: Secondary | ICD-10-CM | POA: Diagnosis not present

## 2018-03-20 DIAGNOSIS — Z125 Encounter for screening for malignant neoplasm of prostate: Secondary | ICD-10-CM | POA: Diagnosis not present

## 2018-03-20 DIAGNOSIS — E785 Hyperlipidemia, unspecified: Secondary | ICD-10-CM

## 2018-03-20 LAB — LIPID PANEL
CHOL/HDL RATIO: 3
Cholesterol: 158 mg/dL (ref 0–200)
HDL: 50.5 mg/dL (ref >39.00)
LDL Cholesterol: 82 mg/dL (ref 0–99)
NonHDL: 107.54
Triglycerides: 128 mg/dL (ref 0.0–149.0)
VLDL: 25.6 mg/dL (ref 0.0–40.0)

## 2018-03-20 LAB — COMPREHENSIVE METABOLIC PANEL
ALT: 33 U/L (ref 0–53)
AST: 18 U/L (ref 0–37)
Albumin: 4.9 g/dL (ref 3.5–5.2)
Alkaline Phosphatase: 68 U/L (ref 39–117)
BILIRUBIN TOTAL: 0.5 mg/dL (ref 0.2–1.2)
BUN: 11 mg/dL (ref 6–23)
CO2: 29 meq/L (ref 19–32)
Calcium: 9.9 mg/dL (ref 8.4–10.5)
Chloride: 101 mEq/L (ref 96–112)
Creatinine, Ser: 0.86 mg/dL (ref 0.40–1.50)
GFR: 95.99 mL/min (ref >60.00)
GLUCOSE: 105 mg/dL — AB (ref 70–99)
Potassium: 4.8 mEq/L (ref 3.5–5.1)
SODIUM: 138 meq/L (ref 135–145)
Total Protein: 7.3 g/dL (ref 6.0–8.3)

## 2018-03-20 LAB — PSA: PSA: 1.55 ng/mL (ref 0.10–4.00)

## 2018-03-20 LAB — CBC WITH DIFFERENTIAL/PLATELET
BASOS ABS: 0 10*3/uL (ref 0.0–0.1)
Basophils Relative: 0.3 % (ref 0.0–3.0)
EOS PCT: 2.8 % (ref 0.0–5.0)
Eosinophils Absolute: 0.1 10*3/uL (ref 0.0–0.7)
HCT: 43 % (ref 39.0–52.0)
Hemoglobin: 14.7 g/dL (ref 13.0–17.0)
Lymphocytes Relative: 20.9 % (ref 12.0–46.0)
Lymphs Abs: 0.8 10*3/uL (ref 0.7–4.0)
MCHC: 34.2 g/dL (ref 30.0–36.0)
MCV: 93.6 fl (ref 78.0–100.0)
MONOS PCT: 8.8 % (ref 3.0–12.0)
Monocytes Absolute: 0.4 10*3/uL (ref 0.1–1.0)
Neutro Abs: 2.7 10*3/uL (ref 1.4–7.7)
Neutrophils Relative %: 67.2 % (ref 43.0–77.0)
PLATELETS: 323 10*3/uL (ref 150.0–400.0)
RBC: 4.59 Mil/uL (ref 4.22–5.81)
RDW: 13.3 % (ref 11.5–15.5)
WBC: 4 10*3/uL (ref 4.0–10.5)

## 2018-03-20 NOTE — Assessment & Plan Note (Signed)
Stable Per rheum

## 2018-03-20 NOTE — Patient Instructions (Signed)
Preventive Care 40-64 Years, Male Preventive care refers to lifestyle choices and visits with your health care provider that can promote health and wellness. What does preventive care include?  A yearly physical exam. This is also called an annual well check.  Dental exams once or twice a year.  Routine eye exams. Ask your health care provider how often you should have your eyes checked.  Personal lifestyle choices, including: ? Daily care of your teeth and gums. ? Regular physical activity. ? Eating a healthy diet. ? Avoiding tobacco and drug use. ? Limiting alcohol use. ? Practicing safe sex. ? Taking low-dose aspirin every day starting at age 46. What happens during an annual well check? The services and screenings done by your health care provider during your annual well check will depend on your age, overall health, lifestyle risk factors, and family history of disease. Counseling Your health care provider may ask you questions about your:  Alcohol use.  Tobacco use.  Drug use.  Emotional well-being.  Home and relationship well-being.  Sexual activity.  Eating habits.  Work and work Statistician.  Screening You may have the following tests or measurements:  Height, weight, and BMI.  Blood pressure.  Lipid and cholesterol levels. These may be checked every 5 years, or more frequently if you are over 64 years old.  Skin check.  Lung cancer screening. You may have this screening every year starting at age 55 if you have a 30-pack-year history of smoking and currently smoke or have quit within the past 15 years.  Fecal occult blood test (FOBT) of the stool. You may have this test every year starting at age 20.  Flexible sigmoidoscopy or colonoscopy. You may have a sigmoidoscopy every 5 years or a colonoscopy every 10 years starting at age 88.  Prostate cancer screening. Recommendations will vary depending on your family history and other risks.  Hepatitis C  blood test.  Hepatitis B blood test.  Sexually transmitted disease (STD) testing.  Diabetes screening. This is done by checking your blood sugar (glucose) after you have not eaten for a while (fasting). You may have this done every 1-3 years.  Discuss your test results, treatment options, and if necessary, the need for more tests with your health care provider. Vaccines Your health care provider may recommend certain vaccines, such as:  Influenza vaccine. This is recommended every year.  Tetanus, diphtheria, and acellular pertussis (Tdap, Td) vaccine. You may need a Td booster every 10 years.  Varicella vaccine. You may need this if you have not been vaccinated.  Zoster vaccine. You may need this after age 72.  Measles, mumps, and rubella (MMR) vaccine. You may need at least one dose of MMR if you were born in 1957 or later. You may also need a second dose.  Pneumococcal 13-valent conjugate (PCV13) vaccine. You may need this if you have certain conditions and have not been vaccinated.  Pneumococcal polysaccharide (PPSV23) vaccine. You may need one or two doses if you smoke cigarettes or if you have certain conditions.  Meningococcal vaccine. You may need this if you have certain conditions.  Hepatitis A vaccine. You may need this if you have certain conditions or if you travel or work in places where you may be exposed to hepatitis A.  Hepatitis B vaccine. You may need this if you have certain conditions or if you travel or work in places where you may be exposed to hepatitis B.  Haemophilus influenzae type b (Hib) vaccine.  You may need this if you have certain risk factors.  Talk to your health care provider about which screenings and vaccines you need and how often you need them. This information is not intended to replace advice given to you by your health care provider. Make sure you discuss any questions you have with your health care provider. Document Released: 05/29/2015  Document Revised: 01/20/2016 Document Reviewed: 03/03/2015 Elsevier Interactive Patient Education  [employer].

## 2018-03-20 NOTE — Assessment & Plan Note (Signed)
Tolerating statin, encouraged heart healthy diet, avoid trans fats, minimize simple carbs and saturated fats. Increase exercise as tolerated 

## 2018-03-20 NOTE — Assessment & Plan Note (Signed)
ghm utd Check labs See AVS 

## 2018-03-20 NOTE — Progress Notes (Signed)
Patient ID: Timothy Huynh, male    DOB: 22-Nov-1955  Age: 62 y.o. MRN: [id number]    Subjective:  Subjective  HPI Ryot Burrous presents for cpe and labs.  Pt is seeing Dr Amil Amen for his RA and low back pain with sciatica---  He sees Dr Louanne Skye for this.  No other complaints  Review of Systems  Constitutional: Negative for appetite change, diaphoresis, fatigue and unexpected weight change.  Eyes: Negative for pain, redness and visual disturbance.  Respiratory: Negative for cough, chest tightness, shortness of breath and wheezing.   Cardiovascular: Negative for chest pain, palpitations and leg swelling.  Endocrine: Negative for cold intolerance, heat intolerance, polydipsia, polyphagia and polyuria.  Genitourinary: Negative for difficulty urinating, dysuria and frequency.  Musculoskeletal: Positive for back pain.  Neurological: Negative for dizziness, light-headedness, numbness and headaches.    History Past Medical History:  Diagnosis Date  . Hyperlipidemia   . Hypertension   . RA (rheumatoid arthritis) ([place])     He has a past surgical history that includes Hernia repair.   His family history includes Cancer in his father; Dementia in his mother; Diabetes in his father.He reports that he has never smoked. He has never used smokeless tobacco. He reports that he drinks alcohol. He reports that he does not use drugs.  Current Outpatient Medications on File Prior to Visit  Medication Sig Dispense Refill  . atorvastatin (LIPITOR) 20 MG tablet TAKE 1 TABLET DAILY 90 tablet 0  . folic acid (FOLVITE) 1 MG tablet Take 1 mg by mouth daily.    . methotrexate (RHEUMATREX) 2.5 MG tablet Take 10 mg by mouth every Monday.     No current facility-administered medications on file prior to visit.      Objective:  Objective  Physical Exam  Constitutional: He is oriented to person, place, and time. Vital signs are normal. He appears well-developed and well-nourished. He is sleeping. No distress.    HENT:  Head: Normocephalic and atraumatic.  Right Ear: External ear normal.  Left Ear: External ear normal.  Nose: Nose normal.  Mouth/Throat: Oropharynx is clear and moist. No oropharyngeal exudate.  Eyes: Pupils are equal, round, and reactive to light. Conjunctivae and EOM are normal. Right eye exhibits no discharge. Left eye exhibits no discharge.  Neck: Normal range of motion. Neck supple. No JVD present. No thyromegaly present.  Cardiovascular: Normal rate, regular rhythm and intact distal pulses.  No murmur heard. Pulmonary/Chest: Effort normal and breath sounds normal. No respiratory distress. He has no wheezes. He has no rales. He exhibits no tenderness.  Abdominal: Soft. Bowel sounds are normal. He exhibits no distension and no mass. There is no tenderness. There is no rebound and no guarding.  Genitourinary: Rectum normal, prostate normal and penis normal. Rectal exam shows guaiac negative stool.  Musculoskeletal: Normal range of motion. He exhibits no edema or tenderness.  Lymphadenopathy:    He has no cervical adenopathy.  Neurological: He is alert and oriented to person, place, and time. He has normal reflexes. He displays normal reflexes. No cranial nerve deficit. He exhibits normal muscle tone.  Skin: Skin is warm and dry. No rash noted. He is not diaphoretic. No erythema.  Psychiatric: He has a normal mood and affect. His behavior is normal. Judgment and thought content normal.  Nursing note and vitals reviewed.  BP 118/70 (BP Location: Right Arm, Cuff Size: Normal)   Pulse (!) 55   Temp 98.1 F (36.7 C) (Oral)   Resp 16   Ht  5\' 7"  (1.702 m)   Wt 176 lb 12.8 oz (80.2 kg)   SpO2 98%   BMI 27.69 kg/m  Wt Readings from Last 3 Encounters:  03/20/18 176 lb 12.8 oz (80.2 kg)  03/16/17 172 lb (78 kg)  02/21/17 165 lb (74.8 kg)     Lab Results  Component Value Date   WBC 4.0 03/20/2018   HGB 14.7 03/20/2018   HCT 43.0 03/20/2018   PLT 323.0 03/20/2018   GLUCOSE  105 (H) 03/20/2018   CHOL 158 03/20/2018   TRIG 128.0 03/20/2018   HDL 50.50 03/20/2018   LDLDIRECT 123.0 03/14/2016   LDLCALC 82 03/20/2018   ALT 33 03/20/2018   AST 18 03/20/2018   NA 138 03/20/2018   K 4.8 03/20/2018   CL 101 03/20/2018   CREATININE 0.86 03/20/2018   BUN 11 03/20/2018   CO2 29 03/20/2018   TSH 2.46 03/16/2017   PSA 1.55 03/20/2018   INR 1.0 09/11/2007   HGBA1C 5.8 06/16/2016   MICROALBUR 0.7 09/27/2012    Dg Chest 2 View  Result Date: 03/16/2016 CLINICAL DATA:  Acute onset of dizziness, nausea, vomiting, bilateral hand tingling and shortness of breath. Initial encounter. EXAM: CHEST  2 VIEW COMPARISON:  None. FINDINGS: The lungs are well-aerated. Peribronchial thickening is noted. Mild bibasilar atelectasis is seen. There is no evidence of pleural effusion or pneumothorax. The heart is normal in size; the mediastinal contour is within normal limits. No acute osseous abnormalities are seen. IMPRESSION: Peribronchial thickening noted.  Mild bibasilar atelectasis seen. Electronically Signed   By: Garald Balding M.D.   On: 03/16/2016 21:27   Ct Head Wo Contrast  Result Date: 03/16/2016 CLINICAL DATA:  Dizziness.  Near syncope.  Bradycardia.  Nausea. EXAM: CT HEAD WITHOUT CONTRAST TECHNIQUE: Contiguous axial images were obtained from the base of the skull through the vertex without intravenous contrast. COMPARISON:  None. FINDINGS: Brain: No evidence of parenchymal hemorrhage or extra-axial fluid collection. No mass lesion, mass effect, or midline shift. No CT evidence of acute infarction. Cerebral volume is age appropriate. No ventriculomegaly. Vascular: No hyperdense vessel or unexpected calcification. Skull: No evidence of calvarial fracture. Sinuses/Orbits: Mucous retention cyst versus polyp in the inferior left maxillary sinus. No fluid levels in the paranasal sinuses. Other:  The mastoid air cells are unopacified. IMPRESSION: 1.  No evidence of acute intracranial  abnormality. 2. Mucous retention cyst versus polyp in the inferior left maxillary sinus. Electronically Signed   By: Ilona Sorrel M.D.   On: 03/16/2016 21:50     Assessment & Plan:  Plan  I am having [name] maintain his folic acid, methotrexate, and atorvastatin.  No orders of the defined types were placed in this encounter.   Problem List Items Addressed This Visit      Unprioritized   Hyperlipidemia LDL goal <100    Tolerating statin, encouraged heart healthy diet, avoid trans fats, minimize simple carbs and saturated fats. Increase exercise as tolerated      Preventative health care - Primary    ghm utd Check labs  See AVS      Relevant Orders   CBC with Differential/Platelet (Completed)   Comprehensive metabolic panel (Completed)   Lipid panel (Completed)   PSA (Completed)   Ambulatory referral to Gastroenterology   Rheumatoid arthritis involving multiple sites with positive rheumatoid factor ([place])    Stable Per rheum        Other Visit Diagnoses    Need for pneumococcal vaccination  Relevant Orders   Pneumococcal polysaccharide vaccine 23-valent greater than or equal to 2yo subcutaneous/IM (Completed)      Follow-up: Return if symptoms worsen or fail to improve.  Ann Held, DO

## 2018-03-28 DIAGNOSIS — M7701 Medial epicondylitis, right elbow: Secondary | ICD-10-CM | POA: Diagnosis not present

## 2018-03-28 DIAGNOSIS — M0609 Rheumatoid arthritis without rheumatoid factor, multiple sites: Secondary | ICD-10-CM | POA: Diagnosis not present

## 2018-04-22 ENCOUNTER — Other Ambulatory Visit: Payer: Self-pay | Admitting: Family Medicine

## 2018-07-27 DIAGNOSIS — M7701 Medial epicondylitis, right elbow: Secondary | ICD-10-CM | POA: Diagnosis not present

## 2018-07-27 DIAGNOSIS — Z79899 Other long term (current) drug therapy: Secondary | ICD-10-CM | POA: Diagnosis not present

## 2018-07-27 DIAGNOSIS — M0609 Rheumatoid arthritis without rheumatoid factor, multiple sites: Secondary | ICD-10-CM | POA: Diagnosis not present

## 2018-10-25 DIAGNOSIS — M0609 Rheumatoid arthritis without rheumatoid factor, multiple sites: Secondary | ICD-10-CM | POA: Diagnosis not present

## 2019-02-08 ENCOUNTER — Other Ambulatory Visit: Payer: Self-pay

## 2019-02-08 ENCOUNTER — Ambulatory Visit (INDEPENDENT_AMBULATORY_CARE_PROVIDER_SITE_OTHER): Payer: BC Managed Care – PPO

## 2019-02-08 DIAGNOSIS — Z23 Encounter for immunization: Secondary | ICD-10-CM | POA: Diagnosis not present

## 2019-02-08 NOTE — Progress Notes (Signed)
Here for flu shot

## 2019-03-21 DIAGNOSIS — M7701 Medial epicondylitis, right elbow: Secondary | ICD-10-CM | POA: Diagnosis not present

## 2019-03-21 DIAGNOSIS — M5416 Radiculopathy, lumbar region: Secondary | ICD-10-CM | POA: Diagnosis not present

## 2019-03-21 DIAGNOSIS — M0609 Rheumatoid arthritis without rheumatoid factor, multiple sites: Secondary | ICD-10-CM | POA: Diagnosis not present

## 2019-04-20 ENCOUNTER — Other Ambulatory Visit: Payer: Self-pay | Admitting: Family Medicine

## 2019-04-22 ENCOUNTER — Ambulatory Visit (INDEPENDENT_AMBULATORY_CARE_PROVIDER_SITE_OTHER): Payer: BC Managed Care – PPO | Admitting: Family Medicine

## 2019-04-22 ENCOUNTER — Other Ambulatory Visit: Payer: Self-pay

## 2019-04-22 ENCOUNTER — Encounter: Payer: Self-pay | Admitting: Family Medicine

## 2019-04-22 VITALS — BP 122/70 | HR 60 | Temp 97.2°F | Resp 18 | Ht 67.0 in | Wt 171.4 lb

## 2019-04-22 DIAGNOSIS — M5441 Lumbago with sciatica, right side: Secondary | ICD-10-CM | POA: Diagnosis not present

## 2019-04-22 DIAGNOSIS — E785 Hyperlipidemia, unspecified: Secondary | ICD-10-CM | POA: Insufficient documentation

## 2019-04-22 DIAGNOSIS — Z125 Encounter for screening for malignant neoplasm of prostate: Secondary | ICD-10-CM | POA: Diagnosis not present

## 2019-04-22 DIAGNOSIS — Z0000 Encounter for general adult medical examination without abnormal findings: Secondary | ICD-10-CM | POA: Diagnosis not present

## 2019-04-22 DIAGNOSIS — M0579 Rheumatoid arthritis with rheumatoid factor of multiple sites without organ or systems involvement: Secondary | ICD-10-CM

## 2019-04-22 DIAGNOSIS — N529 Male erectile dysfunction, unspecified: Secondary | ICD-10-CM | POA: Insufficient documentation

## 2019-04-22 LAB — CBC WITH DIFFERENTIAL/PLATELET
Basophils Absolute: 0 10*3/uL (ref 0.0–0.1)
Basophils Relative: 0.2 % (ref 0.0–3.0)
Eosinophils Absolute: 0.2 10*3/uL (ref 0.0–0.7)
Eosinophils Relative: 2.6 % (ref 0.0–5.0)
HCT: 39.1 % (ref 39.0–52.0)
Hemoglobin: 13.1 g/dL (ref 13.0–17.0)
Lymphocytes Relative: 20.3 % (ref 12.0–46.0)
Lymphs Abs: 1.2 10*3/uL (ref 0.7–4.0)
MCHC: 33.5 g/dL (ref 30.0–36.0)
MCV: 94.1 fl (ref 78.0–100.0)
Monocytes Absolute: 0.5 10*3/uL (ref 0.1–1.0)
Monocytes Relative: 8.4 % (ref 3.0–12.0)
Neutro Abs: 4.1 10*3/uL (ref 1.4–7.7)
Neutrophils Relative %: 68.5 % (ref 43.0–77.0)
Platelets: 323 10*3/uL (ref 150.0–400.0)
RBC: 4.15 Mil/uL — ABNORMAL LOW (ref 4.22–5.81)
RDW: 13.4 % (ref 11.5–15.5)
WBC: 6 10*3/uL (ref 4.0–10.5)

## 2019-04-22 LAB — LIPID PANEL
Cholesterol: 146 mg/dL (ref 0–200)
HDL: 47.1 mg/dL (ref >39.00)
LDL Cholesterol: 77 mg/dL (ref 0–99)
NonHDL: 98.97
Total CHOL/HDL Ratio: 3
Triglycerides: 108 mg/dL (ref 0.0–149.0)
VLDL: 21.6 mg/dL (ref 0.0–40.0)

## 2019-04-22 LAB — COMPREHENSIVE METABOLIC PANEL
ALT: 28 U/L (ref 0–53)
AST: 12 U/L (ref 0–37)
Albumin: 4.6 g/dL (ref 3.5–5.2)
Alkaline Phosphatase: 66 U/L (ref 39–117)
BUN: 11 mg/dL (ref 6–23)
CO2: 29 mEq/L (ref 19–32)
Calcium: 9.5 mg/dL (ref 8.4–10.5)
Chloride: 101 mEq/L (ref 96–112)
Creatinine, Ser: 0.81 mg/dL (ref 0.40–1.50)
GFR: 96.43 mL/min (ref >60.00)
Glucose, Bld: 88 mg/dL (ref 70–99)
Potassium: 4.4 mEq/L (ref 3.5–5.1)
Sodium: 138 mEq/L (ref 135–145)
Total Bilirubin: 0.7 mg/dL (ref 0.2–1.2)
Total Protein: 6.8 g/dL (ref 6.0–8.3)

## 2019-04-22 LAB — PSA: PSA: 1.75 ng/mL (ref 0.10–4.00)

## 2019-04-22 LAB — TSH: TSH: 3.49 u[IU]/mL (ref 0.35–4.50)

## 2019-04-22 MED ORDER — CELECOXIB 100 MG PO CAPS: 100.0000 mg | ORAL_CAPSULE | Freq: Every day | ORAL | 3 refills | Status: DC

## 2019-04-22 MED ORDER — SILDENAFIL CITRATE 20 MG PO TABS: 20.0000 mg | ORAL_TABLET | Freq: Three times a day (TID) | ORAL | 5 refills | Status: DC

## 2019-04-22 NOTE — Assessment & Plan Note (Signed)
Tolerating statin, encouraged heart healthy diet, avoid trans fats, minimize simple carbs and saturated fats. Increase exercise as tolerated 

## 2019-04-22 NOTE — Progress Notes (Signed)
Patient ID: Timothy Huynh, male    DOB: 08/30/1955  Age: 63 y.o. MRN: [id number]    Subjective:  Subjective  HPI Timothy Huynh presents for cpe and c/o low back pain with sitting --- he has been to ortho -- last visit about 1.5-2 years ago.     Review of Systems  Constitutional: Negative.  Negative for appetite change, diaphoresis, fatigue and unexpected weight change.  HENT: Negative for congestion, ear pain, hearing loss, nosebleeds, postnasal drip, rhinorrhea, sinus pressure, sneezing and tinnitus.   Eyes: Negative for photophobia, pain, discharge, redness, itching and visual disturbance.  Respiratory: Negative.  Negative for cough, chest tightness, shortness of breath and wheezing.   Cardiovascular: Negative.  Negative for chest pain, palpitations and leg swelling.  Gastrointestinal: Negative for abdominal distention, abdominal pain, anal bleeding, blood in stool and constipation.  Endocrine: Negative.  Negative for cold intolerance, heat intolerance, polydipsia, polyphagia and polyuria.  Genitourinary: Negative.  Negative for difficulty urinating, dysuria and frequency.  Musculoskeletal: Positive for back pain. Negative for myalgias, neck pain and neck stiffness.  Skin: Negative.   Allergic/Immunologic: Negative.   Neurological: Negative for dizziness, weakness, light-headedness, numbness and headaches.  Psychiatric/Behavioral: Negative for agitation, confusion, decreased concentration, dysphoric mood, sleep disturbance and suicidal ideas. The patient is not nervous/anxious.     History Past Medical History:  Diagnosis Date  . Hyperlipidemia   . Hypertension   . RA (rheumatoid arthritis) ([place])     He has a past surgical history that includes Hernia repair.   His family history includes Cancer in his father; Dementia in his mother; Diabetes in his father.He reports that he has never smoked. He has never used smokeless tobacco. He reports current alcohol use. He reports that he does  not use drugs.  Current Outpatient Medications on File Prior to Visit  Medication Sig Dispense Refill  . atorvastatin (LIPITOR) 20 MG tablet TAKE 1 TABLET DAILY 90 tablet 0  . folic acid (FOLVITE) 1 MG tablet Take 1 mg by mouth daily.    . methotrexate (RHEUMATREX) 2.5 MG tablet Take 10 mg by mouth every Monday.     No current facility-administered medications on file prior to visit.      Objective:  Objective  Physical Exam Constitutional:      General: He is not in acute distress.    Appearance: He is well-developed. He is not diaphoretic.  HENT:     Head: Normocephalic and atraumatic.     Right Ear: External ear normal.     Left Ear: External ear normal.     Nose: Nose normal.     Mouth/Throat:     Pharynx: No oropharyngeal exudate.  Eyes:     General:        Right eye: No discharge.        Left eye: No discharge.     Conjunctiva/sclera: Conjunctivae normal.     Pupils: Pupils are equal, round, and reactive to light.  Neck:     Musculoskeletal: Normal range of motion and neck supple.     Thyroid: No thyromegaly.     Vascular: No JVD.  Cardiovascular:     Rate and Rhythm: Normal rate and regular rhythm.     Heart sounds: No murmur. No friction rub. No gallop.   Pulmonary:     Effort: Pulmonary effort is normal. No respiratory distress.     Breath sounds: Normal breath sounds. No wheezing or rales.  Chest:     Chest wall: No tenderness.  Abdominal:     General: Bowel sounds are normal. There is no distension.     Palpations: Abdomen is soft. There is no mass.     Tenderness: There is no abdominal tenderness. There is no guarding or rebound.  Genitourinary:    Penis: Normal.      Prostate: Normal.     Rectum: Normal. Guaiac result negative.  Musculoskeletal: Normal range of motion.        General: No tenderness.  Lymphadenopathy:     Cervical: No cervical adenopathy.  Skin:    General: Skin is warm and dry.     Coloration: Skin is not pale.     Findings: No  erythema or rash.  Neurological:     Mental Status: He is alert and oriented to person, place, and time.     Motor: No abnormal muscle tone.     Deep Tendon Reflexes: Reflexes are normal and symmetric. Reflexes normal.  Psychiatric:        Behavior: Behavior normal.        Thought Content: Thought content normal.        Judgment: Judgment normal.    BP 122/70 (BP Location: Right Arm, Patient Position: Sitting, Cuff Size: Normal)   Pulse 60   Temp (!) 97.2 F (36.2 C) (Temporal)   Resp 18   Ht 5\' 7"  (1.702 m)   Wt 171 lb 6.4 oz (77.7 kg)   SpO2 98%   BMI 26.85 kg/m  Wt Readings from Last 3 Encounters:  04/22/19 171 lb 6.4 oz (77.7 kg)  03/20/18 176 lb 12.8 oz (80.2 kg)  03/16/17 172 lb (78 kg)     Lab Results  Component Value Date   WBC 4.0 03/20/2018   HGB 14.7 03/20/2018   HCT 43.0 03/20/2018   PLT 323.0 03/20/2018   GLUCOSE 105 (H) 03/20/2018   CHOL 158 03/20/2018   TRIG 128.0 03/20/2018   HDL 50.50 03/20/2018   LDLDIRECT 123.0 03/14/2016   LDLCALC 82 03/20/2018   ALT 33 03/20/2018   AST 18 03/20/2018   NA 138 03/20/2018   K 4.8 03/20/2018   CL 101 03/20/2018   CREATININE 0.86 03/20/2018   BUN 11 03/20/2018   CO2 29 03/20/2018   TSH 2.46 03/16/2017   PSA 1.55 03/20/2018   INR 1.0 09/11/2007   HGBA1C 5.8 06/16/2016   MICROALBUR 0.7 09/27/2012    Dg Chest 2 View  Result Date: 03/16/2016 CLINICAL DATA:  Acute onset of dizziness, nausea, vomiting, bilateral hand tingling and shortness of breath. Initial encounter. EXAM: CHEST  2 VIEW COMPARISON:  None. FINDINGS: The lungs are well-aerated. Peribronchial thickening is noted. Mild bibasilar atelectasis is seen. There is no evidence of pleural effusion or pneumothorax. The heart is normal in size; the mediastinal contour is within normal limits. No acute osseous abnormalities are seen. IMPRESSION: Peribronchial thickening noted.  Mild bibasilar atelectasis seen. Electronically Signed   By: Garald Balding M.D.   On:  03/16/2016 21:27   Ct Head Wo Contrast  Result Date: 03/16/2016 CLINICAL DATA:  Dizziness.  Near syncope.  Bradycardia.  Nausea. EXAM: CT HEAD WITHOUT CONTRAST TECHNIQUE: Contiguous axial images were obtained from the base of the skull through the vertex without intravenous contrast. COMPARISON:  None. FINDINGS: Brain: No evidence of parenchymal hemorrhage or extra-axial fluid collection. No mass lesion, mass effect, or midline shift. No CT evidence of acute infarction. Cerebral volume is age appropriate. No ventriculomegaly. Vascular: No hyperdense vessel or unexpected calcification. Skull: No evidence  of calvarial fracture. Sinuses/Orbits: Mucous retention cyst versus polyp in the inferior left maxillary sinus. No fluid levels in the paranasal sinuses. Other:  The mastoid air cells are unopacified. IMPRESSION: 1.  No evidence of acute intracranial abnormality. 2. Mucous retention cyst versus polyp in the inferior left maxillary sinus. Electronically Signed   By: Ilona Sorrel M.D.   On: 03/16/2016 21:50     Assessment & Plan:  Plan  I am having [name] start on sildenafil and celecoxib. I am also having him maintain his folic acid, methotrexate, and atorvastatin.  Meds ordered this encounter  Medications  . sildenafil (REVATIO) 20 MG tablet    Sig: Take 1 tablet (20 mg total) by mouth 3 (three) times daily.    Dispense:  30 tablet    Refill:  5  . celecoxib (CELEBREX) 100 MG capsule    Sig: Take 1 capsule (100 mg total) by mouth daily.    Dispense:  90 capsule    Refill:  3    Problem List Items Addressed This Visit      Unprioritized   Erectile dysfunction    rx viagra       Relevant Medications   sildenafil (REVATIO) 20 MG tablet   Hyperlipidemia    Tolerating statin, encouraged heart healthy diet, avoid trans fats, minimize simple carbs and saturated fats. Increase exercise as tolerated      Relevant Medications   sildenafil (REVATIO) 20 MG tablet   Other Relevant  Orders   Lipid panel   Comprehensive metabolic panel   Hyperlipidemia LDL goal <100    Tolerating statin, encouraged heart healthy diet, avoid trans fats, minimize simple carbs and saturated fats. Increase exercise as tolerated      Relevant Medications   sildenafil (REVATIO) 20 MG tablet   LOW BACK PAIN, ACUTE   Relevant Medications   celecoxib (CELEBREX) 100 MG capsule   Preventative health care - Primary    ghm utd Pt refuses flu shot See AVS Check labs      Relevant Orders   Lipid panel   PSA   TSH   CBC with Differential   Comprehensive metabolic panel   Rheumatoid arthritis involving multiple sites with positive rheumatoid factor (HCC)    F/u rheum       Relevant Medications   celecoxib (CELEBREX) 100 MG capsule      Follow-up: Return in about 6 months (around 10/21/2019), or if symptoms worsen or fail to improve, for hyperlipidemia.  Ann Held, DO

## 2019-04-22 NOTE — Patient Instructions (Signed)
Preventive Care 40-62 Years Old, Male Preventive care refers to lifestyle choices and visits with your health care provider that can promote health and wellness. This includes:  A yearly physical exam. This is also called an annual well check.  Regular dental and eye exams.  Immunizations.  Screening for certain conditions.  Healthy lifestyle choices, such as eating a healthy diet, getting regular exercise, not using drugs or products that contain nicotine and tobacco, and limiting alcohol use. What can I expect for my preventive care visit? Physical exam Your health care provider will check:  Height and weight. These may be used to calculate body mass index (BMI), which is a measurement that tells if you are at a healthy weight.  Heart rate and blood pressure.  Your skin for abnormal spots. Counseling Your health care provider may ask you questions about:  Alcohol, tobacco, and drug use.  Emotional well-being.  Home and relationship well-being.  Sexual activity.  Eating habits.  Work and work environment. What immunizations do I need?  Influenza (flu) vaccine  This is recommended every year. Tetanus, diphtheria, and pertussis (Tdap) vaccine  You may need a Td booster every 10 years. Varicella (chickenpox) vaccine  You may need this vaccine if you have not already been vaccinated. Zoster (shingles) vaccine  You may need this after age 60. Measles, mumps, and rubella (MMR) vaccine  You may need at least one dose of MMR if you were born in 1957 or later. You may also need a second dose. Pneumococcal conjugate (PCV13) vaccine  You may need this if you have certain conditions and were not previously vaccinated. Pneumococcal polysaccharide (PPSV23) vaccine  You may need one or two doses if you smoke cigarettes or if you have certain conditions. Meningococcal conjugate (MenACWY) vaccine  You may need this if you have certain conditions. Hepatitis A vaccine   You may need this if you have certain conditions or if you travel or work in places where you may be exposed to hepatitis A. Hepatitis B vaccine  You may need this if you have certain conditions or if you travel or work in places where you may be exposed to hepatitis B. Haemophilus influenzae type b (Hib) vaccine  You may need this if you have certain risk factors. Human papillomavirus (HPV) vaccine  If recommended by your health care provider, you may need three doses over 6 months. You may receive vaccines as individual doses or as more than one vaccine together in one shot (combination vaccines). Talk with your health care provider about the risks and benefits of combination vaccines. What tests do I need? Blood tests  Lipid and cholesterol levels. These may be checked every 5 years, or more frequently if you are over 50 years old.  Hepatitis C test.  Hepatitis B test. Screening  Lung cancer screening. You may have this screening every year starting at age 55 if you have a 30-pack-year history of smoking and currently smoke or have quit within the past 15 years.  Prostate cancer screening. Recommendations will vary depending on your family history and other risks.  Colorectal cancer screening. All adults should have this screening starting at age 50 and continuing until age 75. Your health care provider may recommend screening at age 45 if you are at increased risk. You will have tests every 1-10 years, depending on your results and the type of screening test.  Diabetes screening. This is done by checking your blood sugar (glucose) after you have not eaten   for a while (fasting). You may have this done every 1-3 years.  Sexually transmitted disease (STD) testing. Follow these instructions at home: Eating and drinking  Eat a diet that includes fresh fruits and vegetables, whole grains, lean protein, and low-fat dairy products.  Take vitamin and mineral supplements as recommended  by your health care provider.  Do not drink alcohol if your health care provider tells you not to drink.  If you drink alcohol: ? Limit how much you have to 0-2 drinks a day. ? Be aware of how much alcohol is in your drink. In the [place] one drink equals one 12 oz bottle of beer (355 mL), one 5 oz glass of wine (148 mL), or one 1 oz glass of hard liquor (44 mL). Lifestyle  Take daily care of your teeth and gums.  Stay active. Exercise for at least 30 minutes on 5 or more days each week.  Do not use any products that contain nicotine or tobacco, such as cigarettes, e-cigarettes, and chewing tobacco. If you need help quitting, ask your health care provider.  If you are sexually active, practice safe sex. Use a condom or other form of protection to prevent STIs (sexually transmitted infections).  Talk with your health care provider about taking a low-dose aspirin every day starting at age 33. What's next?  Go to your health care provider once a year for a well check visit.  Ask your health care provider how often you should have your eyes and teeth checked.  Stay up to date on all vaccines. This information is not intended to replace advice given to you by your health care provider. Make sure you discuss any questions you have with your health care provider. Document Released: 05/29/2015 Document Revised: 04/26/2018 Document Reviewed: 04/26/2018 Elsevier Patient Education  2020 [employer].

## 2019-04-22 NOTE — Assessment & Plan Note (Signed)
ghm utd Pt refuses flu shot See AVS Check labs

## 2019-04-22 NOTE — Assessment & Plan Note (Signed)
F/u rheum

## 2019-04-22 NOTE — Assessment & Plan Note (Signed)
rx viagra

## 2019-06-20 DIAGNOSIS — M0609 Rheumatoid arthritis without rheumatoid factor, multiple sites: Secondary | ICD-10-CM | POA: Diagnosis not present

## 2019-06-20 DIAGNOSIS — Z79899 Other long term (current) drug therapy: Secondary | ICD-10-CM | POA: Diagnosis not present

## 2019-07-18 DIAGNOSIS — M0609 Rheumatoid arthritis without rheumatoid factor, multiple sites: Secondary | ICD-10-CM | POA: Diagnosis not present

## 2019-07-18 DIAGNOSIS — Z79899 Other long term (current) drug therapy: Secondary | ICD-10-CM | POA: Diagnosis not present

## 2019-08-20 ENCOUNTER — Other Ambulatory Visit: Payer: Self-pay | Admitting: Family Medicine

## 2019-08-20 ENCOUNTER — Encounter: Payer: Self-pay | Admitting: Gastroenterology

## 2019-08-20 ENCOUNTER — Telehealth: Payer: Self-pay | Admitting: Family Medicine

## 2019-08-20 DIAGNOSIS — Z1211 Encounter for screening for malignant neoplasm of colon: Secondary | ICD-10-CM

## 2019-08-20 NOTE — Telephone Encounter (Signed)
Patient is requesting a colonoscopy. Not sure if referral neede Please advise

## 2019-08-20 NOTE — Telephone Encounter (Signed)
Referral placed.

## 2019-08-20 NOTE — Telephone Encounter (Signed)
Okay to place referral

## 2019-08-21 DIAGNOSIS — L821 Other seborrheic keratosis: Secondary | ICD-10-CM | POA: Diagnosis not present

## 2019-08-21 DIAGNOSIS — L570 Actinic keratosis: Secondary | ICD-10-CM | POA: Diagnosis not present

## 2019-08-23 ENCOUNTER — Other Ambulatory Visit: Payer: Self-pay | Admitting: Family Medicine

## 2019-08-26 ENCOUNTER — Other Ambulatory Visit: Payer: Self-pay | Admitting: Family Medicine

## 2019-08-26 ENCOUNTER — Encounter: Payer: Self-pay | Admitting: Family Medicine

## 2019-08-26 ENCOUNTER — Other Ambulatory Visit: Payer: Self-pay

## 2019-08-26 ENCOUNTER — Telehealth: Payer: Self-pay

## 2019-08-26 ENCOUNTER — Telehealth (INDEPENDENT_AMBULATORY_CARE_PROVIDER_SITE_OTHER): Payer: BC Managed Care – PPO | Admitting: Family Medicine

## 2019-08-26 VITALS — Resp 18 | Ht 67.0 in | Wt 170.0 lb

## 2019-08-26 DIAGNOSIS — T7840XA Allergy, unspecified, initial encounter: Secondary | ICD-10-CM | POA: Diagnosis not present

## 2019-08-26 MED ORDER — METHYLPREDNISOLONE ACETATE 80 MG/ML IJ SUSP
80.0000 mg | Freq: Once | INTRAMUSCULAR | Status: AC
  Administered 2019-08-26: 80 mg via INTRAMUSCULAR

## 2019-08-26 MED ORDER — MAGIC MOUTHWASH W/LIDOCAINE: 5.0000 mL | Freq: Four times a day (QID) | ORAL | 0 refills | Status: DC | PRN

## 2019-08-26 MED ORDER — PREDNISONE 10 MG PO TABS: ORAL_TABLET | ORAL | 0 refills | Status: DC

## 2019-08-26 NOTE — Addendum Note (Signed)
Addended by: Sanda Linger on: 08/26/2019 02:52 PM   Modules accepted: Orders

## 2019-08-26 NOTE — Telephone Encounter (Signed)
Nurse Assessment Nurse: Vallery Sa, RN, Cathy Date/Time (Eastern Time): 08/26/2019 7:08:51 AM Confirm and document reason for call. If symptomatic, describe symptoms. ---Timothy Huynh states that he developed swollen, burning eyes and Hives in his mouth 3 days ago that is worse this morning. No severe breathing or swallowing difficulty. No wheezing. No fever. Alert and responsive. Has the patient had close contact with a person known or suspected to have the novel coronavirus illness OR traveled / lives in area with major community spread (including international travel) in the last 14 days from the onset of symptoms? * If Asymptomatic, screen for exposure and travel within the last 14 days. ---No Does the patient have any new or worsening symptoms? ---Yes Will a triage be completed? ---Yes Related visit to physician within the last 2 weeks? ---No Does the PT have any chronic conditions? (i.e. diabetes, asthma, this includes High risk factors for pregnancy, etc.) ---Yes List chronic conditions. ---RA Is this a behavioral health or substance abuse call? ---No Guidelines Guideline Title Affirmed Question Affirmed Notes Nurse Date/Time (Eastern Time) Hives [1] Swollen tongue AND [2] rapid onset Vallery Sa, RN, Timothy Huynh 08/26/2019 7:12:03 AM Disp. Time Eilene Ghazi Time) Disposition Final User 08/26/2019 7:16:17 AM 911 Outcome Documentation Tr[place]ull, RN, Timothy Huynh  Call Id: [id number] [place]. Time Eilene Ghazi Time) Disposition Final User Reason: Willy declined the Call 911 disposition. Reinforced the Call 911 disposition. He has someone with him at this time and will decide which ER to go to. 08/26/2019 7:15:00 AM Call EMS 911 Now Yes Trumbull, RN, Timothy Huynh   Appt scheduled today w/ Dr. Etter Sjogren

## 2019-08-26 NOTE — Progress Notes (Signed)
Virtual Visit via Video Note  I connected with Timothy Huynh on 08/26/19 at 11:20 AM EDT by a video enabled telemedicine application and verified that I am speaking with the correct person using two identifiers.  Location: Patient: home alone  Provider: office    I discussed the limitations of evaluation and management by telemedicine and the availability of in person appointments. The patient expressed understanding and agreed to proceed.  History of Present Illness: Pt is home c/o sores in mouth and itchy eyes after using allergy eye drops --- he had the itchy eyes prior to the eye drops but they he developed sores in his mouth and eyes got worse   no sob No cp Observations/Objective:  Vitals:   08/26/19 1124  Resp: 18  temp 98.6  Eyes are red and teary Unable to see the sores in his mouth No sob  Assessment and Plan: 1. Allergic reaction to drug, initial encounter Pt will come in for depo medrol 60 mg im Start pred tomorrow  And mouth wash today con't benadryl po  rto prn  - predniSONE (DELTASONE) 10 MG tablet; TAKE 3 TABLETS PO QD FOR 3 DAYS THEN TAKE 2 TABLETS PO QD FOR 3 DAYS THEN TAKE 1 TABLET PO QD FOR 3 DAYS THEN TAKE 1/2 TAB PO QD FOR 3 DAYS  Dispense: 20 tablet; Refill: 0 - magic mouthwash w/lidocaine SOLN; Take 5 mLs by mouth 4 (four) times daily as needed for mouth pain. Swish and Spit  Dispense: 240 mL; Refill: 0   Follow Up Instructions:    I discussed the assessment and treatment plan with the patient. The patient was provided an opportunity to ask questions and all were answered. The patient agreed with the plan and demonstrated an understanding of the instructions.   The patient was advised to call back or seek an in-person evaluation if the symptoms worsen or if the condition fails to improve as anticipated.  I provided 25 minutes of non-face-to-face time during this encounter.   Ann Held, DO

## 2019-09-05 ENCOUNTER — Ambulatory Visit (AMBULATORY_SURGERY_CENTER): Payer: Self-pay | Admitting: *Deleted

## 2019-09-05 ENCOUNTER — Other Ambulatory Visit: Payer: Self-pay

## 2019-09-05 VITALS — Temp 98.1°F | Ht 67.0 in | Wt 165.0 lb

## 2019-09-05 DIAGNOSIS — Z1211 Encounter for screening for malignant neoplasm of colon: Secondary | ICD-10-CM

## 2019-09-05 NOTE — Progress Notes (Signed)

## 2019-09-17 ENCOUNTER — Encounter: Payer: Self-pay | Admitting: Gastroenterology

## 2019-09-17 DIAGNOSIS — Z79899 Other long term (current) drug therapy: Secondary | ICD-10-CM | POA: Diagnosis not present

## 2019-09-17 DIAGNOSIS — M0609 Rheumatoid arthritis without rheumatoid factor, multiple sites: Secondary | ICD-10-CM | POA: Diagnosis not present

## 2019-09-17 DIAGNOSIS — M7701 Medial epicondylitis, right elbow: Secondary | ICD-10-CM | POA: Diagnosis not present

## 2019-09-17 DIAGNOSIS — M5416 Radiculopathy, lumbar region: Secondary | ICD-10-CM | POA: Diagnosis not present

## 2019-09-19 ENCOUNTER — Encounter: Payer: Self-pay | Admitting: Gastroenterology

## 2019-09-19 ENCOUNTER — Other Ambulatory Visit: Payer: Self-pay

## 2019-09-19 ENCOUNTER — Ambulatory Visit (AMBULATORY_SURGERY_CENTER): Payer: BC Managed Care – PPO | Admitting: Gastroenterology

## 2019-09-19 VITALS — BP 130/76 | HR 52 | Temp 95.9°F | Resp 13 | Ht 67.0 in | Wt 171.0 lb

## 2019-09-19 DIAGNOSIS — Z1211 Encounter for screening for malignant neoplasm of colon: Secondary | ICD-10-CM | POA: Diagnosis not present

## 2019-09-19 MED ORDER — SODIUM CHLORIDE 0.9 % IV SOLN: 500.0000 mL | Freq: Once | INTRAVENOUS | Status: DC

## 2019-09-19 NOTE — Patient Instructions (Signed)
Handout on diverticulosis given to you today  ° °YOU HAD AN ENDOSCOPIC PROCEDURE TODAY AT THE [facility]:   Refer to the procedure report that was given to you for any specific questions about what was found during the examination.  If the procedure report does not answer your questions, please call your gastroenterologist to clarify.  If you requested that your care partner not be given the details of your procedure findings, then the procedure report has been included in a sealed envelope for you to review at your convenience later. ° °YOU SHOULD EXPECT: Some feelings of bloating in the abdomen. Passage of more gas than usual.  Walking can help get rid of the air that was put into your GI tract during the procedure and reduce the bloating. If you had a lower endoscopy (such as a colonoscopy or flexible sigmoidoscopy) you may notice spotting of blood in your stool or on the toilet paper. If you underwent a bowel prep for your procedure, you may not have a normal bowel movement for a few days. ° °Please Note:  You might notice some irritation and congestion in your nose or some drainage.  This is from the oxygen used during your procedure.  There is no need for concern and it should clear up in a day or so. ° °SYMPTOMS TO REPORT IMMEDIATELY: ° °Following lower endoscopy (colonoscopy or flexible sigmoidoscopy): ° Excessive amounts of blood in the stool ° Significant tenderness or worsening of abdominal pains ° Swelling of the abdomen that is new, acute ° Fever of 100°F or higher ° °For urgent or emergent issues, a gastroenterologist can be reached at any hour by calling [phone]. °Do not use MyChart messaging for urgent concerns.  ° ° °DIET:  We do recommend a small meal at first, but then you may proceed to your regular diet.  Drink plenty of fluids but you should avoid alcoholic beverages for 24 hours. ° °ACTIVITY:  You should plan to take it easy for the rest of today and you should NOT DRIVE  or use heavy machinery until tomorrow (because of the sedation medicines used during the test).   ° °FOLLOW UP: °Our staff will call the number listed on your records 48-72 hours following your procedure to check on you and address any questions or concerns that you may have regarding the information given to you following your procedure. If we do not reach you, we will leave a message.  We will attempt to reach you two times.  During this call, we will ask if you have developed any symptoms of COVID 19. If you develop any symptoms (ie: fever, flu-like symptoms, shortness of breath, cough etc.) before then, please call [phone].  If you test positive for Covid 19 in the 2 weeks post procedure, please call and report this information to us.   ° °SIGNATURES/CONFIDENTIALITY: °You and/or your care partner have signed paperwork which will be entered into your electronic medical record.  These signatures attest to the fact that that the information above on your After Visit Summary has been reviewed and is understood.  Full responsibility of the confidentiality of this discharge information lies with you and/or your care-partner.  °

## 2019-09-19 NOTE — Op Note (Signed)
[facility] Patient Name: Timothy Huynh Procedure Date: 09/19/2019 8:29 AM MRN: [id number] Endoscopist: Mallie Mussel L. Loletha Carrow , MD Age: 64 Referring MD:  Date of Birth: 1956-04-17 Gender: Male Account #: [id number] Procedure:                Colonoscopy Indications:              Screening for colorectal malignant neoplasm (last                            colonoscopy 10/2007) Medicines:                Monitored Anesthesia Care Procedure:                Pre-Anesthesia Assessment:                           - Prior to the procedure, a History and Physical                            was performed, and patient medications and                            allergies were reviewed. The patient's tolerance of                            previous anesthesia was also reviewed. The risks                            and benefits of the procedure and the sedation                            options and risks were discussed with the patient.                            All questions were answered, and informed consent                            was obtained. Prior Anticoagulants: The patient has                            taken no previous anticoagulant or antiplatelet                            agents. ASA Grade Assessment: II - A patient with                            mild systemic disease. After reviewing the risks                            and benefits, the patient was deemed in                            satisfactory condition to undergo the procedure.  After obtaining informed consent, the colonoscope                            was passed under direct vision. Throughout the                            procedure, the patient's blood pressure, pulse, and                            oxygen saturations were monitored continuously. The                            Colonoscope was introduced through the anus and                            advanced to the the terminal ileum, with                       identification of the appendiceal orifice and IC                            valve. The colonoscopy was performed without                            difficulty. The patient tolerated the procedure                            well. The quality of the bowel preparation was                            excellent. The terminal ileum, ileocecal valve,                            appendiceal orifice, and rectum were photographed.                            The quality of the bowel preparation was evaluated                            using the BBPS [facility] Bowel Preparation Scale)                            with scores of: Right Colon = 3, Transverse Colon =                            3 and Left Colon = 2. The total BBPS score equals                            8. The bowel preparation used was Miralax. Scope In: 8:43:51 AM Scope Out: 8:55:38 AM Scope Withdrawal Time: 0 hours 8 minutes 54 seconds  Total Procedure Duration: 0 hours 11 minutes 47 seconds  Findings:                 The perianal and digital rectal examinations  were                            normal.                           The terminal ileum appeared normal.                           Multiple diverticula were found in the left colon.                           The exam was otherwise without abnormality on                            direct and retroflexion views. Complications:            No immediate complications. Estimated Blood Loss:     Estimated blood loss: none. Impression:               - The examined portion of the ileum was normal.                           - Diverticulosis in the left colon.                           - The examination was otherwise normal on direct                            and retroflexion views.                           - No specimens collected. Recommendation:           - Patient has a contact number available for                            emergencies. The signs and symptoms of  potential                            delayed complications were discussed with the                            patient. Return to normal activities tomorrow.                            Written discharge instructions were provided to the                            patient.                           - Resume previous diet.                           - Continue present medications.                           -  Repeat colonoscopy in 10 years for screening                            purposes. Brezlyn Manrique L. Loletha Carrow, MD 09/19/2019 9:00:57 AM This report has been signed electronically.

## 2019-09-23 ENCOUNTER — Telehealth: Payer: Self-pay | Admitting: *Deleted

## 2019-09-23 ENCOUNTER — Telehealth: Payer: Self-pay

## 2019-09-23 NOTE — Telephone Encounter (Signed)
  Follow up Call-  Call back number 09/19/2019  Post procedure Call Back phone  # [phone]  Permission to leave phone message Yes  Some recent data might be hidden     Patient questions:  Do you have a fever, pain , or abdominal swelling? No. Pain Score  0 *  Have you tolerated food without any problems? Yes.    Have you been able to return to your normal activities? Yes.    Do you have any questions about your discharge instructions: Diet   No. Medications  No. Follow up visit  No.  Do you have questions or concerns about your Care? No.  Actions: * If pain score is 4 or above: No action needed, pain <4.   1. Have you developed a fever since your procedure? NO  2.   Have you had an respiratory symptoms (SOB or cough) since your procedure? No  3.   Have you tested positive for COVID 19 since your procedure No  4.   Have you had any family members/close contacts diagnosed with the COVID 19 since your procedure?  No   If yes to any of these questions please route to Joylene John, RN and Erenest Rasher, RN

## 2019-09-23 NOTE — Telephone Encounter (Signed)
Attempted f/u phone call. No answer. Left message. °

## 2019-10-21 ENCOUNTER — Ambulatory Visit: Payer: BC Managed Care – PPO | Admitting: Family Medicine

## 2019-12-19 DIAGNOSIS — M0609 Rheumatoid arthritis without rheumatoid factor, multiple sites: Secondary | ICD-10-CM | POA: Diagnosis not present

## 2020-02-17 ENCOUNTER — Other Ambulatory Visit (HOSPITAL_BASED_OUTPATIENT_CLINIC_OR_DEPARTMENT_OTHER): Payer: Self-pay | Admitting: Internal Medicine

## 2020-02-17 ENCOUNTER — Other Ambulatory Visit: Payer: Self-pay

## 2020-02-17 ENCOUNTER — Ambulatory Visit: Payer: BC Managed Care – PPO | Attending: Internal Medicine

## 2020-02-17 DIAGNOSIS — Z23 Encounter for immunization: Secondary | ICD-10-CM

## 2020-02-17 NOTE — Progress Notes (Signed)
   Covid-19 Vaccination Clinic  Name:  [name]    MRN: [id number] DOB: 19-Aug-1955  02/17/2020  Mr. Mollenkopf was observed post Covid-19 immunization for 15 minutes without incident. He was provided with Vaccine Information Sheet and instruction to access the V-Safe system. Vaccinated by Harriet Pho.  Mr. Pecore was instructed to call 911 with any severe reactions post vaccine: Marland Kitchen Difficulty breathing  . Swelling of face and throat  . A fast heartbeat  . A bad rash all over body  . Dizziness and weakness

## 2020-02-24 MED FILL — PFIZER-BIONTECH COVID-19 VA: 30 | 1 days supply | Qty: 0 | Fill #0

## 2020-03-16 ENCOUNTER — Other Ambulatory Visit: Payer: Self-pay

## 2020-03-16 ENCOUNTER — Telehealth: Payer: Self-pay

## 2020-03-16 ENCOUNTER — Encounter: Payer: Self-pay | Admitting: Family Medicine

## 2020-03-16 ENCOUNTER — Ambulatory Visit (INDEPENDENT_AMBULATORY_CARE_PROVIDER_SITE_OTHER): Payer: BC Managed Care – PPO | Admitting: Family Medicine

## 2020-03-16 VITALS — BP 128/70 | HR 54 | Temp 98.2°F | Resp 12 | Ht 67.0 in | Wt 167.2 lb

## 2020-03-16 DIAGNOSIS — Z23 Encounter for immunization: Secondary | ICD-10-CM | POA: Diagnosis not present

## 2020-03-16 DIAGNOSIS — E785 Hyperlipidemia, unspecified: Secondary | ICD-10-CM | POA: Diagnosis not present

## 2020-03-16 DIAGNOSIS — M5441 Lumbago with sciatica, right side: Secondary | ICD-10-CM

## 2020-03-16 DIAGNOSIS — Z0000 Encounter for general adult medical examination without abnormal findings: Secondary | ICD-10-CM | POA: Diagnosis not present

## 2020-03-16 DIAGNOSIS — N529 Male erectile dysfunction, unspecified: Secondary | ICD-10-CM

## 2020-03-16 DIAGNOSIS — M0579 Rheumatoid arthritis with rheumatoid factor of multiple sites without organ or systems involvement: Secondary | ICD-10-CM

## 2020-03-16 MED ORDER — SILDENAFIL CITRATE 20 MG PO TABS: 20.0000 mg | ORAL_TABLET | Freq: Three times a day (TID) | ORAL | 5 refills | Status: DC

## 2020-03-16 MED ORDER — CELECOXIB 100 MG PO CAPS: 100.0000 mg | ORAL_CAPSULE | Freq: Every day | ORAL | 3 refills | Status: DC

## 2020-03-16 NOTE — Assessment & Plan Note (Signed)
Per rheum On methotrexate

## 2020-03-16 NOTE — Assessment & Plan Note (Signed)
Tolerating statin, encouraged heart healthy diet, avoid trans fats, minimize simple carbs and saturated fats. Increase exercise as tolerated 

## 2020-03-16 NOTE — Telephone Encounter (Signed)
PA initiated via Covermymeds; KEY: [id number]. Awaiting determination.

## 2020-03-16 NOTE — Progress Notes (Signed)
Patient ID: Timothy Huynh, male    DOB: [birth year]  Age: 64 y.o. MRN: [id number]    Subjective:  Subjective  HPI Timothy Huynh presents for cpe and med refill for celebrex--- we also need to f/u cholesterol   He has struggled some with the RA and is seeing dr Timothy Huynh for this -- he also has had some more back issues but is not taking the celebrex.  No other complaints     Review of Systems  Constitutional: Negative for appetite change, diaphoresis, fatigue and unexpected weight change.  Eyes: Negative for pain, redness and visual disturbance.  Respiratory: Negative for cough, chest tightness, shortness of breath and wheezing.   Cardiovascular: Negative for chest pain, palpitations and leg swelling.  Endocrine: Negative for cold intolerance, heat intolerance, polydipsia, polyphagia and polyuria.  Genitourinary: Negative for difficulty urinating, dysuria and frequency.  Musculoskeletal: Positive for arthralgias and back pain.  Neurological: Negative for dizziness, light-headedness, numbness and headaches.    History Past Medical History:  Diagnosis Date  . Allergy   . Hyperlipidemia   . Hypertension   . RA (rheumatoid arthritis) ([place])     He has a past surgical history that includes Hernia repair.   His family history includes Cancer in his father; Dementia in his mother; Diabetes in his father.He reports that he has never smoked. He has never used smokeless tobacco. He reports current alcohol use. He reports that he does not use drugs.  Current Outpatient Medications on File Prior to Visit  Medication Sig Dispense Refill  . atorvastatin (LIPITOR) 20 MG tablet TAKE 1 TABLET DAILY 90 tablet 3  . folic acid (FOLVITE) 1 MG tablet Take 1 mg by mouth daily.    . methotrexate (RHEUMATREX) 2.5 MG tablet Take 15 mg by mouth every Monday.      No current facility-administered medications on file prior to visit.    Health Maintenance  Topic Date Due  . TETANUS/TDAP  03/16/2030  . INFLUENZA VACCINE  Completed  . COVID-19 Vaccine  Completed  . Hepatitis C Screening  Completed  . HIV Screening  Completed     Objective:  Objective  Physical Exam Vitals and nursing note reviewed.  Constitutional:      General: He is sleeping. He is not in acute distress.    Appearance: He is well-developed. He is not diaphoretic.  HENT:     Head: Normocephalic and atraumatic.     Right Ear: External ear normal.     Left Ear: External ear normal.     Nose: Nose normal.     Mouth/Throat:     Pharynx: No oropharyngeal exudate.  Eyes:     General:        Right eye: No discharge.        Left eye: No discharge.     Conjunctiva/sclera: Conjunctivae normal.     Pupils: Pupils are equal, round, and reactive to light.  Neck:     Thyroid: No thyromegaly.     Vascular: No JVD.  Cardiovascular:     Rate and Rhythm: Normal rate and regular rhythm.     Heart sounds: No murmur heard.  No friction rub. No gallop.   Pulmonary:     Effort: Pulmonary effort is normal. No respiratory distress.  Breath sounds: Normal breath sounds. No wheezing or rales.  Chest:     Chest wall: No tenderness.  Abdominal:     General: Bowel sounds are normal. There is no distension.     Palpations: Abdomen is soft. There is no mass.     Tenderness: There is no abdominal tenderness. There is no guarding or rebound.  Genitourinary:    Comments: Pt prefers gu not be done Rectal was done before colonoscopy  Musculoskeletal:        General: No tenderness. Normal range of motion.     Cervical back: Normal range of motion and neck supple.  Lymphadenopathy:     Cervical: No cervical adenopathy.  Skin:    General: Skin is warm and dry.     Coloration: Skin is not pale.     Findings: No erythema or rash.  Neurological:     Mental Status: He is oriented to person, place, and time.     Motor: No abnormal muscle tone.     Deep Tendon  Reflexes: Reflexes are normal and symmetric. Reflexes normal.  Psychiatric:        Behavior: Behavior normal.        Thought Content: Thought content normal.        Judgment: Judgment normal.    BP 128/70 (BP Location: Right Arm, Cuff Size: Normal)   Pulse (!) 54   Temp 98.2 F (36.8 C) (Oral)   Resp 12   Ht 5\' 7"  (1.702 m)   Wt 167 lb 3.2 oz (75.8 kg)   SpO2 98%   BMI 26.19 kg/m  Wt Readings from Last 3 Encounters:  03/16/20 167 lb 3.2 oz (75.8 kg)  09/19/19 171 lb (77.6 kg)  09/05/19 165 lb (74.8 kg)     Lab Results  Component Value Date   WBC 6.0 04/22/2019   HGB 13.1 04/22/2019   HCT 39.1 04/22/2019   PLT 323.0 04/22/2019   GLUCOSE 88 04/22/2019   CHOL 146 04/22/2019   TRIG 108.0 04/22/2019   HDL 47.10 04/22/2019   LDLDIRECT 123.0 03/14/2016   LDLCALC 77 04/22/2019   ALT 28 04/22/2019   AST 12 04/22/2019   NA 138 04/22/2019   K 4.4 04/22/2019   CL 101 04/22/2019   CREATININE 0.81 04/22/2019   BUN 11 04/22/2019   CO2 29 04/22/2019   TSH 3.49 04/22/2019   PSA 1.75 04/22/2019   INR 1.0 09/11/2007   HGBA1C 5.8 06/16/2016   MICROALBUR 0.7 09/27/2012    DG Chest 2 View  Result Date: 03/16/2016 CLINICAL DATA:  Acute onset of dizziness, nausea, vomiting, bilateral hand tingling and shortness of breath. Initial encounter. EXAM: CHEST  2 VIEW COMPARISON:  None. FINDINGS: The lungs are well-aerated. Peribronchial thickening is noted. Mild bibasilar atelectasis is seen. There is no evidence of pleural effusion or pneumothorax. The heart is normal in size; the mediastinal contour is within normal limits. No acute osseous abnormalities are seen. IMPRESSION: Peribronchial thickening noted.  Mild bibasilar atelectasis seen. Electronically Signed   By: Garald Balding M.D.   On: 03/16/2016 21:27   CT Head Wo Contrast  Result Date: 03/16/2016 CLINICAL DATA:  Dizziness.  Near syncope.  Bradycardia.  Nausea. EXAM: CT HEAD WITHOUT CONTRAST TECHNIQUE: Contiguous axial images  were obtained from the base of the skull through the vertex without intravenous contrast. COMPARISON:  None. FINDINGS: Brain: No evidence of parenchymal hemorrhage or extra-axial fluid collection. No mass lesion, mass effect, or midline shift. No CT evidence of  acute infarction. Cerebral volume is age appropriate. No ventriculomegaly. Vascular: No hyperdense vessel or unexpected calcification. Skull: No evidence of calvarial fracture. Sinuses/Orbits: Mucous retention cyst versus polyp in the inferior left maxillary sinus. No fluid levels in the paranasal sinuses. Other:  The mastoid air cells are unopacified. IMPRESSION: 1.  No evidence of acute intracranial abnormality. 2. Mucous retention cyst versus polyp in the inferior left maxillary sinus. Electronically Signed   By: Ilona Sorrel M.D.   On: 03/16/2016 21:50     Assessment & Plan:  Plan  I have discontinued Timothy Huynh's magic mouthwash w/lidocaine. I am also having him maintain his folic acid, methotrexate, atorvastatin, celecoxib, and sildenafil.  Meds ordered this encounter  Medications  . DISCONTD: sildenafil (REVATIO) 20 MG tablet    Sig: Take 1 tablet (20 mg total) by mouth 3 (three) times daily.    Dispense:  30 tablet    Refill:  5  . DISCONTD: celecoxib (CELEBREX) 100 MG capsule    Sig: Take 1 capsule (100 mg total) by mouth daily.    Dispense:  90 capsule    Refill:  3  . celecoxib (CELEBREX) 100 MG capsule    Sig: Take 1 capsule (100 mg total) by mouth daily.    Dispense:  90 capsule    Refill:  3  . DISCONTD: sildenafil (REVATIO) 20 MG tablet    Sig: Take 1 tablet (20 mg total) by mouth 3 (three) times daily.    Dispense:  30 tablet    Refill:  5  . sildenafil (REVATIO) 20 MG tablet    Sig: Take 1 tablet (20 mg total) by mouth 3 (three) times daily.    Dispense:  30 tablet    Refill:  5    Problem List Items Addressed This Visit      Unprioritized   Erectile dysfunction   Relevant Medications   sildenafil  (REVATIO) 20 MG tablet   Hyperlipidemia    Tolerating statin, encouraged heart healthy diet, avoid trans fats, minimize simple carbs and saturated fats. Increase exercise as tolerated      Relevant Medications   sildenafil (REVATIO) 20 MG tablet   Other Relevant Orders   CBC with Differential/Platelet   Comprehensive metabolic panel   Lipid panel   TSH   Hyperlipidemia LDL goal <100    Tolerating statin, encouraged heart healthy diet, avoid trans fats, minimize simple carbs and saturated fats. Increase exercise as tolerated      Relevant Medications   sildenafil (REVATIO) 20 MG tablet   LOW BACK PAIN, ACUTE    Refill celebrex -- take daily F/u with ortho prn       Relevant Medications   celecoxib (CELEBREX) 100 MG capsule   Preventative health care - Primary    ghm utd Check labs  See AVS      Relevant Orders   CBC with Differential/Platelet   Comprehensive metabolic panel   Lipid panel   PSA   TSH   Rheumatoid arthritis involving multiple sites with positive rheumatoid factor (HCC)    Per rheum On methotrexate      Relevant Medications   celecoxib (CELEBREX) 100 MG capsule    Other Visit Diagnoses    Need for influenza vaccination       Relevant Orders   Flu Vaccine QUAD 6+ mos PF IM (Fluarix Quad PF) (Completed)   Need for Tdap vaccination       Relevant Orders   Tdap vaccine greater than or equal  to 7yo IM (Completed)      Follow-up: Return in about 6 months (around 09/13/2020), or if symptoms worsen or fail to improve, for hyperlipidemia.  Ann Held, DO

## 2020-03-16 NOTE — Patient Instructions (Signed)
Preventive Care 41-63 Years Old, Male Preventive care refers to lifestyle choices and visits with your health care provider that can promote health and wellness. This includes:  A yearly physical exam. This is also called an annual well check.  Regular dental and eye exams.  Immunizations.  Screening for certain conditions.  Healthy lifestyle choices, such as eating a healthy diet, getting regular exercise, not using drugs or products that contain nicotine and tobacco, and limiting alcohol use. What can I expect for my preventive care visit? Physical exam Your health care provider will check:  Height and weight. These may be used to calculate body mass index (BMI), which is a measurement that tells if you are at a healthy weight.  Heart rate and blood pressure.  Your skin for abnormal spots. Counseling Your health care provider may ask you questions about:  Alcohol, tobacco, and drug use.  Emotional well-being.  Home and relationship well-being.  Sexual activity.  Eating habits.  Work and work Statistician. What immunizations do I need?  Influenza (flu) vaccine  This is recommended every year. Tetanus, diphtheria, and pertussis (Tdap) vaccine  You may need a Td booster every 10 years. Varicella (chickenpox) vaccine  You may need this vaccine if you have not already been vaccinated. Zoster (shingles) vaccine  You may need this after age 64. Measles, mumps, and rubella (MMR) vaccine  You may need at least one dose of MMR if you were born in 1957 or later. You may also need a second dose. Pneumococcal conjugate (PCV13) vaccine  You may need this if you have certain conditions and were not previously vaccinated. Pneumococcal polysaccharide (PPSV23) vaccine  You may need one or two doses if you smoke cigarettes or if you have certain conditions. Meningococcal conjugate (MenACWY) vaccine  You may need this if you have certain conditions. Hepatitis A  vaccine  You may need this if you have certain conditions or if you travel or work in places where you may be exposed to hepatitis A. Hepatitis B vaccine  You may need this if you have certain conditions or if you travel or work in places where you may be exposed to hepatitis B. Haemophilus influenzae type b (Hib) vaccine  You may need this if you have certain risk factors. Human papillomavirus (HPV) vaccine  If recommended by your health care provider, you may need three doses over 6 months. You may receive vaccines as individual doses or as more than one vaccine together in one shot (combination vaccines). Talk with your health care provider about the risks and benefits of combination vaccines. What tests do I need? Blood tests  Lipid and cholesterol levels. These may be checked every 5 years, or more frequently if you are over 60 years old.  Hepatitis C test.  Hepatitis B test. Screening  Lung cancer screening. You may have this screening every year starting at age 43 if you have a 30-pack-year history of smoking and currently smoke or have quit within the past 15 years.  Prostate cancer screening. Recommendations will vary depending on your family history and other risks.  Colorectal cancer screening. All adults should have this screening starting at age 72 and continuing until age 2. Your health care provider may recommend screening at age 14 if you are at increased risk. You will have tests every 1-10 years, depending on your results and the type of screening test.  Diabetes screening. This is done by checking your blood sugar (glucose) after you have not eaten  for a while (fasting). You may have this done every 1-3 years.  Sexually transmitted disease (STD) testing. Follow these instructions at home: Eating and drinking  Eat a diet that includes fresh fruits and vegetables, whole grains, lean protein, and low-fat dairy products.  Take vitamin and mineral supplements as  recommended by your health care provider.  Do not drink alcohol if your health care provider tells you not to drink.  If you drink alcohol: ? Limit how much you have to 0-2 drinks a day. ? Be aware of how much alcohol is in your drink. In the [place] one drink equals one 12 oz bottle of beer (355 mL), one 5 oz glass of wine (148 mL), or one 1 oz glass of hard liquor (44 mL). Lifestyle  Take daily care of your teeth and gums.  Stay active. Exercise for at least 30 minutes on 5 or more days each week.  Do not use any products that contain nicotine or tobacco, such as cigarettes, e-cigarettes, and chewing tobacco. If you need help quitting, ask your health care provider.  If you are sexually active, practice safe sex. Use a condom or other form of protection to prevent STIs (sexually transmitted infections).  Talk with your health care provider about taking a low-dose aspirin every day starting at age 53. What's next?  Go to your health care provider once a year for a well check visit.  Ask your health care provider how often you should have your eyes and teeth checked.  Stay up to date on all vaccines. This information is not intended to replace advice given to you by your health care provider. Make sure you discuss any questions you have with your health care provider. Document Revised: 04/26/2018 Document Reviewed: 04/26/2018 Elsevier Patient Education  2020 [employer].

## 2020-03-16 NOTE — Telephone Encounter (Signed)
PA initiated via Covermymeds; KEY: [id number]. PA approved.  [id number]:[id number];Review Type:Prior Auth;Coverage Start Date:02/15/2020;Coverage End Date:03/16/2021;

## 2020-03-16 NOTE — Assessment & Plan Note (Signed)
Refill celebrex -- take daily F/u with ortho prn

## 2020-03-16 NOTE — Telephone Encounter (Signed)
PA denied.  [id number];ZMCEYE:[id number];Review Type:Prior Auth;Appeal Information: Attention:ATTN: [place] [id number]. AESLP,NP,[id number] ZNBVA:[phone] HYH:[phone]; Important - Please read the below note on eAppeals: Please reference the denial letter for information on the rights for an appeal, rationale for the denial, and how to submit an appeal including if any information is needed to support the appeal. Note about urgent situations - Generally, an urgent situation is one which, in the opinion of the provider, the health of the patient may be in serious jeopardy or may experience pain that cannot be adequately controlled while waiting for a decision on the appeal.;

## 2020-03-16 NOTE — Assessment & Plan Note (Signed)
ghm utd Check labs See AVS 

## 2020-03-17 LAB — CBC WITH DIFFERENTIAL/PLATELET
Absolute Monocytes: 423 cells/uL (ref 200–950)
Basophils Absolute: 18 cells/uL (ref 0–200)
Basophils Relative: 0.4 %
Eosinophils Absolute: 170 cells/uL (ref 15–500)
Eosinophils Relative: 3.7 %
HCT: 42.9 % (ref 38.5–50.0)
Hemoglobin: 14.8 g/dL (ref 13.2–17.1)
Lymphs Abs: 994 cells/uL (ref 850–3900)
MCH: 32.8 pg (ref 27.0–33.0)
MCHC: 34.5 g/dL (ref 32.0–36.0)
MCV: 95.1 fL (ref 80.0–100.0)
MPV: 9.5 fL (ref 7.5–12.5)
Monocytes Relative: 9.2 %
Neutro Abs: 2995 cells/uL (ref 1500–7800)
Neutrophils Relative %: 65.1 %
Platelets: 298 10*3/uL (ref 140–400)
RBC: 4.51 10*6/uL (ref 4.20–5.80)
RDW: 12.7 % (ref 11.0–15.0)
Total Lymphocyte: 21.6 %
WBC: 4.6 10*3/uL (ref 3.8–10.8)

## 2020-03-17 LAB — COMPREHENSIVE METABOLIC PANEL
AG Ratio: 2 (calc) (ref 1.0–2.5)
ALT: 25 U/L (ref 9–46)
AST: 14 U/L (ref 10–35)
Albumin: 4.9 g/dL (ref 3.6–5.1)
Alkaline phosphatase (APISO): 71 U/L (ref 35–144)
BUN: 15 mg/dL (ref 7–25)
CO2: 26 mmol/L (ref 20–32)
Calcium: 10.1 mg/dL (ref 8.6–10.3)
Chloride: 101 mmol/L (ref 98–110)
Creat: 0.93 mg/dL (ref 0.70–1.25)
Globulin: 2.4 g/dL (calc) (ref 1.9–3.7)
Glucose, Bld: 114 mg/dL — ABNORMAL HIGH (ref 65–99)
Potassium: 4.7 mmol/L (ref 3.5–5.3)
Sodium: 138 mmol/L (ref 135–146)
Total Bilirubin: 0.6 mg/dL (ref 0.2–1.2)
Total Protein: 7.3 g/dL (ref 6.1–8.1)

## 2020-03-17 LAB — LIPID PANEL
Cholesterol: 157 mg/dL (ref <200)
HDL: 55 mg/dL (ref >=40)
LDL Cholesterol (Calc): 81 mg/dL (calc)
Non-HDL Cholesterol (Calc): 102 mg/dL (calc) (ref <130)
Total CHOL/HDL Ratio: 2.9 (calc) (ref <5.0)
Triglycerides: 113 mg/dL (ref <150)

## 2020-03-17 LAB — TSH: TSH: 2.1 mIU/L (ref 0.40–4.50)

## 2020-03-17 LAB — PSA: PSA: 1.46 ng/mL (ref <=4.0)

## 2020-03-19 DIAGNOSIS — M79644 Pain in right finger(s): Secondary | ICD-10-CM | POA: Diagnosis not present

## 2020-03-19 DIAGNOSIS — Z79899 Other long term (current) drug therapy: Secondary | ICD-10-CM | POA: Diagnosis not present

## 2020-03-19 DIAGNOSIS — M5416 Radiculopathy, lumbar region: Secondary | ICD-10-CM | POA: Diagnosis not present

## 2020-03-19 DIAGNOSIS — M1811 Unilateral primary osteoarthritis of first carpometacarpal joint, right hand: Secondary | ICD-10-CM | POA: Diagnosis not present

## 2020-03-19 DIAGNOSIS — M0609 Rheumatoid arthritis without rheumatoid factor, multiple sites: Secondary | ICD-10-CM | POA: Diagnosis not present

## 2020-04-14 ENCOUNTER — Telehealth: Payer: Self-pay | Admitting: Family Medicine

## 2020-04-14 NOTE — Telephone Encounter (Signed)
Patient states he had a physical appointment on 03/16/20. According to his insurance it was coded incorrectly. He also got a bill from the lab most of the bill got paid except procedure code [phone] for 148.48

## 2020-04-21 NOTE — Telephone Encounter (Signed)
I have sent an email to [employer] asking them to look into this billing concern for the patient.

## 2020-04-30 ENCOUNTER — Telehealth: Payer: Self-pay | Admitting: Family Medicine

## 2020-04-30 ENCOUNTER — Other Ambulatory Visit: Payer: Self-pay

## 2020-04-30 DIAGNOSIS — M5441 Lumbago with sciatica, right side: Secondary | ICD-10-CM

## 2020-04-30 MED ORDER — CELECOXIB 100 MG PO CAPS: 100.0000 mg | ORAL_CAPSULE | Freq: Every day | ORAL | 3 refills | Status: DC

## 2020-04-30 NOTE — Telephone Encounter (Signed)
[facility] states [facility] is unable to fill celecoxib they need prior authorization. Patient states he is completely out if  celecoxib (CELEBREX) 100 MG capsule [[id number]]    [facility] [place], [place]  [address] [place] [address]  Phone:  [phone] Fax:  [phone]

## 2020-04-30 NOTE — Telephone Encounter (Signed)
Called pt back. LVM. Advised that PA was done in November and approved through November 2022

## 2020-09-03 ENCOUNTER — Other Ambulatory Visit: Payer: Self-pay | Admitting: Family Medicine

## 2020-09-23 ENCOUNTER — Ambulatory Visit: Payer: BC Managed Care – PPO | Attending: Internal Medicine

## 2020-09-23 DIAGNOSIS — Z23 Encounter for immunization: Secondary | ICD-10-CM

## 2020-09-23 NOTE — Progress Notes (Signed)
   Covid-19 Vaccination Clinic  Name:  Timothy Huynh    MRN: [id number] DOB: 1956-01-26  09/23/2020  [name] was observed post Covid-19 immunization for 15 minutes without incident. He was provided with Vaccine Information Sheet and instruction to access the V-Safe system.   [name] was instructed to call 911 with any severe reactions post vaccine: Marland Kitchen Difficulty breathing  . Swelling of face and throat  . A fast heartbeat  . A bad rash all over body  . Dizziness and weakness   Immunizations Administered    Name Date Dose VIS Date Route   PFIZER Comrnaty(Gray TOP) Covid-19 Vaccine 09/23/2020  2:31 PM 0.3 mL 04/23/2020 Intramuscular   Manufacturer: [place]   Lot: [id number]   NDC: [phone]

## 2020-09-28 ENCOUNTER — Other Ambulatory Visit (HOSPITAL_BASED_OUTPATIENT_CLINIC_OR_DEPARTMENT_OTHER): Payer: Self-pay

## 2020-09-28 MED ORDER — PFIZER-BIONT COVID-19 VAC-TRIS 30 MCG/0.3ML IM SUSP
INTRAMUSCULAR | 0 refills | Status: DC
  Filled 2020-09-28: qty 0.3, 1d supply, fill #0

## 2020-11-18 DIAGNOSIS — Z79899 Other long term (current) drug therapy: Secondary | ICD-10-CM | POA: Diagnosis not present

## 2020-11-18 DIAGNOSIS — M0609 Rheumatoid arthritis without rheumatoid factor, multiple sites: Secondary | ICD-10-CM | POA: Diagnosis not present

## 2020-12-14 ENCOUNTER — Other Ambulatory Visit: Payer: Self-pay | Admitting: Family Medicine

## 2020-12-29 DIAGNOSIS — M0609 Rheumatoid arthritis without rheumatoid factor, multiple sites: Secondary | ICD-10-CM | POA: Diagnosis not present

## 2020-12-29 DIAGNOSIS — M1811 Unilateral primary osteoarthritis of first carpometacarpal joint, right hand: Secondary | ICD-10-CM | POA: Diagnosis not present

## 2020-12-29 DIAGNOSIS — M5416 Radiculopathy, lumbar region: Secondary | ICD-10-CM | POA: Diagnosis not present

## 2020-12-29 DIAGNOSIS — Z79899 Other long term (current) drug therapy: Secondary | ICD-10-CM | POA: Diagnosis not present

## 2021-02-23 DIAGNOSIS — M0609 Rheumatoid arthritis without rheumatoid factor, multiple sites: Secondary | ICD-10-CM | POA: Diagnosis not present

## 2021-02-26 ENCOUNTER — Other Ambulatory Visit: Payer: Self-pay

## 2021-02-26 ENCOUNTER — Ambulatory Visit (INDEPENDENT_AMBULATORY_CARE_PROVIDER_SITE_OTHER): Payer: BC Managed Care – PPO

## 2021-02-26 DIAGNOSIS — Z23 Encounter for immunization: Secondary | ICD-10-CM | POA: Diagnosis not present

## 2021-03-18 ENCOUNTER — Other Ambulatory Visit: Payer: Self-pay

## 2021-03-19 ENCOUNTER — Ambulatory Visit: Payer: BC Managed Care – PPO | Attending: Internal Medicine

## 2021-03-19 ENCOUNTER — Encounter: Payer: Self-pay | Admitting: Family Medicine

## 2021-03-19 ENCOUNTER — Ambulatory Visit (INDEPENDENT_AMBULATORY_CARE_PROVIDER_SITE_OTHER): Payer: BC Managed Care – PPO | Admitting: Family Medicine

## 2021-03-19 ENCOUNTER — Other Ambulatory Visit: Payer: Self-pay

## 2021-03-19 ENCOUNTER — Other Ambulatory Visit (HOSPITAL_BASED_OUTPATIENT_CLINIC_OR_DEPARTMENT_OTHER): Payer: Self-pay

## 2021-03-19 VITALS — BP 130/80 | HR 58 | Temp 98.2°F | Resp 18 | Ht 67.0 in | Wt 174.0 lb

## 2021-03-19 DIAGNOSIS — M5441 Lumbago with sciatica, right side: Secondary | ICD-10-CM

## 2021-03-19 DIAGNOSIS — Z23 Encounter for immunization: Secondary | ICD-10-CM

## 2021-03-19 DIAGNOSIS — E785 Hyperlipidemia, unspecified: Secondary | ICD-10-CM | POA: Diagnosis not present

## 2021-03-19 DIAGNOSIS — N529 Male erectile dysfunction, unspecified: Secondary | ICD-10-CM | POA: Diagnosis not present

## 2021-03-19 DIAGNOSIS — Z0000 Encounter for general adult medical examination without abnormal findings: Secondary | ICD-10-CM

## 2021-03-19 MED ORDER — SILDENAFIL CITRATE 50 MG PO TABS
50.0000 mg | ORAL_TABLET | Freq: Every day | ORAL | 5 refills | Status: DC | PRN
Start: 2021-03-19 — End: 2022-12-20
  Filled 2021-03-19: qty 10, 10d supply, fill #0
  Filled 2021-10-12: qty 10, 10d supply, fill #1

## 2021-03-19 MED ORDER — ATORVASTATIN CALCIUM 20 MG PO TABS: ORAL_TABLET | ORAL | 3 refills | Status: DC

## 2021-03-19 MED ORDER — CELECOXIB 100 MG PO CAPS: 100.0000 mg | ORAL_CAPSULE | Freq: Every day | ORAL | 3 refills | Status: DC

## 2021-03-19 NOTE — Patient Instructions (Signed)

## 2021-03-19 NOTE — Assessment & Plan Note (Signed)
ghm utd Check labs  See avs  

## 2021-03-19 NOTE — Progress Notes (Signed)
Subjective:   By signing my name below, I, Zite Okoli, attest that this documentation has been prepared under the direction and in the presence of  Roma Schanz R DO. 03/19/2021    Patient ID: Timothy Huynh, male    DOB: June 09, 1955, 65 y.o.   MRN: [id number]  Chief Complaint  Patient presents with   Annual Exam    Pt states fasting    HPI Patient is in today for a comprehensive physical exam.  He was prescribed 20 mg Revatio but never used it because he did not want to take it 3x a day. He never went to fill the prescription and would like it refilled today.   He is requesting for a refill on 20 mg Lipitor.  He has been taking 100 mg Celebrex to manage pain and is doing well on it. He has had no stomach problems. He would also like a refill on it.  He denies fever, hearing loss, ear pain,congestion, sinus pain, sore throat, eye pain, chest pain, palpitations, cough, shortness of breath, wheezing, nausea. vomiting, diarrhea, constipation, blood in stool, dysuria,frequency, hematuria and headaches.   He has receive the flu vaccine. He has 4 Pfizer Covid-19 vaccines at this time and will receive the booster vaccine today.  He is UTD on vision and dental care.   There has been no changes in the family medical history. He has had no recent surgeries.  Past Medical History:  Diagnosis Date   Allergy    Hyperlipidemia    Hypertension    RA (rheumatoid arthritis) ([place])     Past Surgical History:  Procedure Laterality Date   HERNIA REPAIR      Family History  Problem Relation Age of Onset   Cancer Father        Oral --angioplasty expired from cancer   Diabetes Father    Dementia Mother    Colon cancer Neg Hx    Esophageal cancer Neg Hx    Rectal cancer Neg Hx    Stomach cancer Neg Hx     Social History   Socioeconomic History   Marital status: Married    Spouse name: Not on file   Number of children: Not on file   Years of education: Not on file   Highest  education level: Not on file  Occupational History   Occupation: TE Arts development officer: [employer]  Tobacco Use   Smoking status: Never   Smokeless tobacco: Never  Substance and Sexual Activity   Alcohol use: Yes   Drug use: No   Sexual activity: Yes    Partners: Female  Other Topics Concern   Not on file  Social History Narrative   Exercise--- 2-3 x a week   Social Determinants of Health   Financial Resource Strain: Not on file  Food Insecurity: Not on file  Transportation Needs: Not on file  Physical Activity: Not on file  Stress: Not on file  Social Connections: Not on file  Intimate Partner Violence: Not on file    Outpatient Medications Prior to Visit  Medication Sig Dispense Refill   folic acid (FOLVITE) 1 MG tablet Take 1 mg by mouth daily.     methotrexate (RHEUMATREX) 2.5 MG tablet Take 15 mg by mouth every Monday.      sildenafil (REVATIO) 20 MG tablet Take 1 tablet (20 mg total) by mouth 3 (three) times daily. 30 tablet 5   atorvastatin (LIPITOR) 20 MG tablet TAKE 1 TABLET DAILY.  NEED FURTHER EVALUATION AND/OR LAB TESTING BEFORE FURTHER REFILLS. MAKE APPOINTMENT FOR THIS. 90 tablet 0   celecoxib (CELEBREX) 100 MG capsule Take 1 capsule (100 mg total) by mouth daily. 90 capsule 3   COVID-19 mRNA Vac-TriS, Pfizer, (PFIZER-BIONT COVID-19 VAC-TRIS) SUSP injection Inject into the muscle. (Patient not taking: Reported on 03/19/2021) 0.3 mL 0   No facility-administered medications prior to visit.    No Known Allergies  Review of Systems  Constitutional:  Negative for fever.  HENT:  Negative for congestion, ear pain, hearing loss, sinus pain and sore throat.   Eyes:  Negative for blurred vision and pain.  Respiratory:  Negative for cough, sputum production, shortness of breath and wheezing.   Cardiovascular:  Negative for chest pain and palpitations.  Gastrointestinal:  Negative for blood in stool, constipation, diarrhea, nausea and vomiting.   Genitourinary:  Negative for dysuria, frequency, hematuria and urgency.  Musculoskeletal:  Negative for back pain, falls and myalgias.  Neurological:  Negative for dizziness, sensory change, loss of consciousness, weakness and headaches.  Endo/Heme/Allergies:  Negative for environmental allergies. Does not bruise/bleed easily.  Psychiatric/Behavioral:  Negative for depression and suicidal ideas. The patient is not nervous/anxious and does not have insomnia.       Objective:    Physical Exam Constitutional:      General: He is not in acute distress.    Appearance: Normal appearance. He is not ill-appearing.  HENT:     Head: Normocephalic and atraumatic.     Right Ear: Tympanic membrane, ear canal and external ear normal.     Left Ear: Tympanic membrane, ear canal and external ear normal.  Eyes:     Pupils: Pupils are equal, round, and reactive to light.  Cardiovascular:     Rate and Rhythm: Normal rate and regular rhythm.     Pulses: Normal pulses.     Heart sounds: No murmur heard.   No gallop.  Pulmonary:     Effort: Pulmonary effort is normal. No respiratory distress.     Breath sounds: Normal breath sounds. No wheezing or rhonchi.  Abdominal:     General: Bowel sounds are normal. There is no distension.     Palpations: Abdomen is soft.     Tenderness: There is no abdominal tenderness. There is no guarding.     Hernia: No hernia is present.  Genitourinary:    Comments: Will check PSA in blood Musculoskeletal:     Cervical back: Neck supple.  Lymphadenopathy:     Cervical: No cervical adenopathy.  Skin:    General: Skin is warm and dry.     Comments: Keratosis on back  Neurological:     Mental Status: He is alert and oriented to person, place, and time.    BP 130/80 (BP Location: Left Arm, Patient Position: Sitting, Cuff Size: Normal)   Pulse (!) 58   Temp 98.2 F (36.8 C) (Oral)   Resp 18   Ht 5\' 7"  (1.702 m)   Wt 174 lb (78.9 kg)   SpO2 99%   BMI 27.25 kg/m   Wt Readings from Last 3 Encounters:  03/19/21 174 lb (78.9 kg)  03/16/20 167 lb 3.2 oz (75.8 kg)  09/19/19 171 lb (77.6 kg)    Diabetic Foot Exam - Simple   No data filed    Lab Results  Component Value Date   WBC 4.6 03/16/2020   HGB 14.8 03/16/2020   HCT 42.9 03/16/2020   PLT 298 03/16/2020   GLUCOSE 114 (  H) 03/16/2020   CHOL 157 03/16/2020   TRIG 113 03/16/2020   HDL 55 03/16/2020   LDLDIRECT 123.0 03/14/2016   LDLCALC 81 03/16/2020   ALT 25 03/16/2020   AST 14 03/16/2020   NA 138 03/16/2020   K 4.7 03/16/2020   CL 101 03/16/2020   CREATININE 0.93 03/16/2020   BUN 15 03/16/2020   CO2 26 03/16/2020   TSH 2.10 03/16/2020   PSA 1.46 03/16/2020   INR 1.0 09/11/2007   HGBA1C 5.8 06/16/2016   MICROALBUR 0.7 09/27/2012    Lab Results  Component Value Date   TSH 2.10 03/16/2020   Lab Results  Component Value Date   WBC 4.6 03/16/2020   HGB 14.8 03/16/2020   HCT 42.9 03/16/2020   MCV 95.1 03/16/2020   PLT 298 03/16/2020   Lab Results  Component Value Date   NA 138 03/16/2020   K 4.7 03/16/2020   CO2 26 03/16/2020   GLUCOSE 114 (H) 03/16/2020   BUN 15 03/16/2020   CREATININE 0.93 03/16/2020   BILITOT 0.6 03/16/2020   ALKPHOS 66 04/22/2019   AST 14 03/16/2020   ALT 25 03/16/2020   PROT 7.3 03/16/2020   ALBUMIN 4.6 04/22/2019   CALCIUM 10.1 03/16/2020   ANIONGAP 10 03/16/2016   GFR 96.43 04/22/2019   Lab Results  Component Value Date   CHOL 157 03/16/2020   Lab Results  Component Value Date   HDL 55 03/16/2020   Lab Results  Component Value Date   LDLCALC 81 03/16/2020   Lab Results  Component Value Date   TRIG 113 03/16/2020   Lab Results  Component Value Date   CHOLHDL 2.9 03/16/2020   Lab Results  Component Value Date   HGBA1C 5.8 06/16/2016       PSA: Last checked on 03/16/2020. Reading was 1.46. Colonoscopy: Last completed 09/19/2019. There was diverticulosis in the left colon. Otherwise exam was normal. Repeat in 10  years.  Assessment & Plan:   Problem List Items Addressed This Visit       Unprioritized   Erectile dysfunction   Relevant Medications   sildenafil (VIAGRA) 50 MG tablet   Other Relevant Orders   PSA   Hyperlipidemia   Relevant Medications   atorvastatin (LIPITOR) 20 MG tablet   sildenafil (VIAGRA) 50 MG tablet   Other Relevant Orders   Comprehensive metabolic panel   Lipid panel   LOW BACK PAIN, ACUTE   Relevant Medications   celecoxib (CELEBREX) 100 MG capsule   Preventative health care - Primary   Relevant Orders   CBC with Differential/Platelet   Comprehensive metabolic panel   Lipid panel   PSA   TSH    Meds ordered this encounter  Medications   atorvastatin (LIPITOR) 20 MG tablet    Sig: TAKE 1 TABLET DAILY.    Dispense:  90 tablet    Refill:  3   sildenafil (VIAGRA) 50 MG tablet    Sig: Take 1 tablet (50 mg total) by mouth daily as needed for erectile dysfunction.    Dispense:  10 tablet    Refill:  5   celecoxib (CELEBREX) 100 MG capsule    Sig: Take 1 capsule (100 mg total) by mouth daily.    Dispense:  90 capsule    Refill:  3    I,Zite Okoli,acting as a scribe for [employer], DO.,have documented all relevant documentation on the behalf of Ann Held, DO,as directed by  Alferd Apa  Carollee Herter, DO while in the presence of Ann Held, DO.   I,  Ann Held DO., personally preformed the services described in this documentation.  All medical record entries made by the scribe were at my direction and in my presence.  I have reviewed the chart and discharge instructions (if applicable) and agree that the record reflects my personal performance and is accurate and complete. 03/19/2021

## 2021-03-20 LAB — LIPID PANEL
Cholesterol: 142 mg/dL (ref <200)
HDL: 51 mg/dL (ref >=40)
LDL Cholesterol (Calc): 72 mg/dL (calc)
Non-HDL Cholesterol (Calc): 91 mg/dL (calc) (ref <130)
Total CHOL/HDL Ratio: 2.8 (calc) (ref <5.0)
Triglycerides: 103 mg/dL (ref <150)

## 2021-03-20 LAB — COMPREHENSIVE METABOLIC PANEL
AG Ratio: 2 (calc) (ref 1.0–2.5)
ALT: 38 U/L (ref 9–46)
AST: 19 U/L (ref 10–35)
Albumin: 4.7 g/dL (ref 3.6–5.1)
Alkaline phosphatase (APISO): 63 U/L (ref 35–144)
BUN: 12 mg/dL (ref 7–25)
CO2: 26 mmol/L (ref 20–32)
Calcium: 9.8 mg/dL (ref 8.6–10.3)
Chloride: 101 mmol/L (ref 98–110)
Creat: 0.95 mg/dL (ref 0.70–1.35)
Globulin: 2.4 g/dL (calc) (ref 1.9–3.7)
Glucose, Bld: 91 mg/dL (ref 65–99)
Potassium: 4.3 mmol/L (ref 3.5–5.3)
Sodium: 138 mmol/L (ref 135–146)
Total Bilirubin: 0.8 mg/dL (ref 0.2–1.2)
Total Protein: 7.1 g/dL (ref 6.1–8.1)

## 2021-03-20 LAB — PSA: PSA: 1.52 ng/mL (ref <=4.00)

## 2021-03-20 LAB — CBC WITH DIFFERENTIAL/PLATELET
Absolute Monocytes: 392 cells/uL (ref 200–950)
Basophils Absolute: 21 cells/uL (ref 0–200)
Basophils Relative: 0.4 %
Eosinophils Absolute: 111 cells/uL (ref 15–500)
Eosinophils Relative: 2.1 %
HCT: 40.1 % (ref 38.5–50.0)
Hemoglobin: 13.8 g/dL (ref 13.2–17.1)
Lymphs Abs: 1352 cells/uL (ref 850–3900)
MCH: 32.6 pg (ref 27.0–33.0)
MCHC: 34.4 g/dL (ref 32.0–36.0)
MCV: 94.8 fL (ref 80.0–100.0)
MPV: 9.7 fL (ref 7.5–12.5)
Monocytes Relative: 7.4 %
Neutro Abs: 3424 cells/uL (ref 1500–7800)
Neutrophils Relative %: 64.6 %
Platelets: 315 10*3/uL (ref 140–400)
RBC: 4.23 10*6/uL (ref 4.20–5.80)
RDW: 12.4 % (ref 11.0–15.0)
Total Lymphocyte: 25.5 %
WBC: 5.3 10*3/uL (ref 3.8–10.8)

## 2021-03-20 LAB — TSH: TSH: 2.65 mIU/L (ref 0.40–4.50)

## 2021-04-06 ENCOUNTER — Other Ambulatory Visit (HOSPITAL_BASED_OUTPATIENT_CLINIC_OR_DEPARTMENT_OTHER): Payer: Self-pay

## 2021-04-06 MED ORDER — PFIZER COVID-19 VAC BIVALENT 30 MCG/0.3ML IM SUSP
INTRAMUSCULAR | 0 refills | Status: DC
  Filled 2021-04-06: qty 0.3, 1d supply, fill #0

## 2021-06-01 DIAGNOSIS — M0609 Rheumatoid arthritis without rheumatoid factor, multiple sites: Secondary | ICD-10-CM | POA: Diagnosis not present

## 2021-06-01 DIAGNOSIS — Z79899 Other long term (current) drug therapy: Secondary | ICD-10-CM | POA: Diagnosis not present

## 2021-06-01 DIAGNOSIS — M1811 Unilateral primary osteoarthritis of first carpometacarpal joint, right hand: Secondary | ICD-10-CM | POA: Diagnosis not present

## 2021-06-01 DIAGNOSIS — M5416 Radiculopathy, lumbar region: Secondary | ICD-10-CM | POA: Diagnosis not present

## 2021-08-02 DIAGNOSIS — L570 Actinic keratosis: Secondary | ICD-10-CM | POA: Diagnosis not present

## 2021-08-02 DIAGNOSIS — L720 Epidermal cyst: Secondary | ICD-10-CM | POA: Diagnosis not present

## 2021-08-02 DIAGNOSIS — D2371 Other benign neoplasm of skin of right lower limb, including hip: Secondary | ICD-10-CM | POA: Diagnosis not present

## 2021-08-02 DIAGNOSIS — L821 Other seborrheic keratosis: Secondary | ICD-10-CM | POA: Diagnosis not present

## 2021-08-02 DIAGNOSIS — L820 Inflamed seborrheic keratosis: Secondary | ICD-10-CM | POA: Diagnosis not present

## 2021-08-30 DIAGNOSIS — M0609 Rheumatoid arthritis without rheumatoid factor, multiple sites: Secondary | ICD-10-CM | POA: Diagnosis not present

## 2021-10-12 ENCOUNTER — Other Ambulatory Visit (HOSPITAL_BASED_OUTPATIENT_CLINIC_OR_DEPARTMENT_OTHER): Payer: Self-pay

## 2022-01-24 DIAGNOSIS — M5416 Radiculopathy, lumbar region: Secondary | ICD-10-CM | POA: Diagnosis not present

## 2022-01-24 DIAGNOSIS — M1811 Unilateral primary osteoarthritis of first carpometacarpal joint, right hand: Secondary | ICD-10-CM | POA: Diagnosis not present

## 2022-01-24 DIAGNOSIS — M0609 Rheumatoid arthritis without rheumatoid factor, multiple sites: Secondary | ICD-10-CM | POA: Diagnosis not present

## 2022-01-24 DIAGNOSIS — Z79899 Other long term (current) drug therapy: Secondary | ICD-10-CM | POA: Diagnosis not present

## 2022-02-13 ENCOUNTER — Other Ambulatory Visit: Payer: Self-pay | Admitting: Family Medicine

## 2022-02-13 DIAGNOSIS — E785 Hyperlipidemia, unspecified: Secondary | ICD-10-CM

## 2022-03-03 ENCOUNTER — Other Ambulatory Visit (HOSPITAL_BASED_OUTPATIENT_CLINIC_OR_DEPARTMENT_OTHER): Payer: Self-pay

## 2022-03-03 MED ORDER — COMIRNATY 30 MCG/0.3ML IM SUSY
PREFILLED_SYRINGE | INTRAMUSCULAR | 0 refills | Status: DC
  Filled 2022-03-03: qty 0.3, 1d supply, fill #0

## 2022-03-03 MED ORDER — FLUAD QUADRIVALENT 0.5 ML IM PRSY
PREFILLED_SYRINGE | INTRAMUSCULAR | 0 refills | Status: DC
  Filled 2022-03-03: qty 0.5, 1d supply, fill #0

## 2022-03-22 ENCOUNTER — Encounter: Payer: Self-pay | Admitting: Family Medicine

## 2022-03-22 ENCOUNTER — Ambulatory Visit (INDEPENDENT_AMBULATORY_CARE_PROVIDER_SITE_OTHER): Payer: BC Managed Care – PPO | Admitting: Family Medicine

## 2022-03-22 VITALS — BP 120/88 | HR 64 | Temp 98.4°F | Resp 18 | Ht 67.0 in | Wt 173.2 lb

## 2022-03-22 DIAGNOSIS — E785 Hyperlipidemia, unspecified: Secondary | ICD-10-CM

## 2022-03-22 DIAGNOSIS — N529 Male erectile dysfunction, unspecified: Secondary | ICD-10-CM

## 2022-03-22 DIAGNOSIS — M0579 Rheumatoid arthritis with rheumatoid factor of multiple sites without organ or systems involvement: Secondary | ICD-10-CM | POA: Diagnosis not present

## 2022-03-22 DIAGNOSIS — Z0000 Encounter for general adult medical examination without abnormal findings: Secondary | ICD-10-CM

## 2022-03-22 DIAGNOSIS — Z23 Encounter for immunization: Secondary | ICD-10-CM

## 2022-03-22 LAB — CBC WITH DIFFERENTIAL/PLATELET
Basophils Absolute: 0 10*3/uL (ref 0.0–0.1)
Basophils Relative: 0.3 % (ref 0.0–3.0)
Eosinophils Absolute: 0.1 10*3/uL (ref 0.0–0.7)
Eosinophils Relative: 2.8 % (ref 0.0–5.0)
HCT: 41.1 % (ref 39.0–52.0)
Hemoglobin: 13.7 g/dL (ref 13.0–17.0)
Lymphocytes Relative: 18.8 % (ref 12.0–46.0)
Lymphs Abs: 1 10*3/uL (ref 0.7–4.0)
MCHC: 33.3 g/dL (ref 30.0–36.0)
MCV: 95.2 fl (ref 78.0–100.0)
Monocytes Absolute: 0.5 10*3/uL (ref 0.1–1.0)
Monocytes Relative: 9.5 % (ref 3.0–12.0)
Neutro Abs: 3.6 10*3/uL (ref 1.4–7.7)
Neutrophils Relative %: 68.6 % (ref 43.0–77.0)
Platelets: 367 10*3/uL (ref 150.0–400.0)
RBC: 4.32 Mil/uL (ref 4.22–5.81)
RDW: 13 % (ref 11.5–15.5)
WBC: 5.3 10*3/uL (ref 4.0–10.5)

## 2022-03-22 LAB — COMPREHENSIVE METABOLIC PANEL
ALT: 36 U/L (ref 0–53)
AST: 20 U/L (ref 0–37)
Albumin: 4.5 g/dL (ref 3.5–5.2)
Alkaline Phosphatase: 74 U/L (ref 39–117)
BUN: 16 mg/dL (ref 6–23)
CO2: 31 mEq/L (ref 19–32)
Calcium: 9.8 mg/dL (ref 8.4–10.5)
Chloride: 102 mEq/L (ref 96–112)
Creatinine, Ser: 1.05 mg/dL (ref 0.40–1.50)
GFR: 74.58 mL/min (ref >60.00)
Glucose, Bld: 87 mg/dL (ref 70–99)
Potassium: 4.7 mEq/L (ref 3.5–5.1)
Sodium: 140 mEq/L (ref 135–145)
Total Bilirubin: 0.4 mg/dL (ref 0.2–1.2)
Total Protein: 6.8 g/dL (ref 6.0–8.3)

## 2022-03-22 LAB — LIPID PANEL
Cholesterol: 141 mg/dL (ref 0–200)
HDL: 47.3 mg/dL (ref >39.00)
LDL Cholesterol: 62 mg/dL (ref 0–99)
NonHDL: 94.13
Total CHOL/HDL Ratio: 3
Triglycerides: 161 mg/dL — ABNORMAL HIGH (ref 0.0–149.0)
VLDL: 32.2 mg/dL (ref 0.0–40.0)

## 2022-03-22 NOTE — Patient Instructions (Signed)
Preventive Care 65 Years and Older, Male Preventive care refers to lifestyle choices and visits with your health care provider that can promote health and wellness. Preventive care visits are also called wellness exams. What can I expect for my preventive care visit? Counseling During your preventive care visit, your health care provider may ask about your: Medical history, including: Past medical problems. Family medical history. History of falls. Current health, including: Emotional well-being. Home life and relationship well-being. Sexual activity. Memory and ability to understand (cognition). Lifestyle, including: Alcohol, nicotine or tobacco, and drug use. Access to firearms. Diet, exercise, and sleep habits. Work and work environment. Sunscreen use. Safety issues such as seatbelt and bike helmet use. Physical exam Your health care provider will check your: Height and weight. These may be used to calculate your BMI (body mass index). BMI is a measurement that tells if you are at a healthy weight. Waist circumference. This measures the distance around your waistline. This measurement also tells if you are at a healthy weight and may help predict your risk of certain diseases, such as type 2 diabetes and high blood pressure. Heart rate and blood pressure. Body temperature. Skin for abnormal spots. What immunizations do I need?  Vaccines are usually given at various ages, according to a schedule. Your health care provider will recommend vaccines for you based on your age, medical history, and lifestyle or other factors, such as travel or where you work. What tests do I need? Screening Your health care provider may recommend screening tests for certain conditions. This may include: Lipid and cholesterol levels. Diabetes screening. This is done by checking your blood sugar (glucose) after you have not eaten for a while (fasting). Hepatitis C test. Hepatitis B test. HIV (human  immunodeficiency virus) test. STI (sexually transmitted infection) testing, if you are at risk. Lung cancer screening. Colorectal cancer screening. Prostate cancer screening. Abdominal aortic aneurysm (AAA) screening. You may need this if you are a current or former smoker. Talk with your health care provider about your test results, treatment options, and if necessary, the need for more tests. Follow these instructions at home: Eating and drinking  Eat a diet that includes fresh fruits and vegetables, whole grains, lean protein, and low-fat dairy products. Limit your intake of foods with high amounts of sugar, saturated fats, and salt. Take vitamin and mineral supplements as recommended by your health care provider. Do not drink alcohol if your health care provider tells you not to drink. If you drink alcohol: Limit how much you have to 0-2 drinks a day. Know how much alcohol is in your drink. In the [place] one drink equals one 12 oz bottle of beer (355 mL), one 5 oz glass of wine (148 mL), or one 1 oz glass of hard liquor (44 mL). Lifestyle Brush your teeth every morning and night with fluoride toothpaste. Floss one time each day. Exercise for at least 30 minutes 5 or more days each week. Do not use any products that contain nicotine or tobacco. These products include cigarettes, chewing tobacco, and vaping devices, such as e-cigarettes. If you need help quitting, ask your health care provider. Do not use drugs. If you are sexually active, practice safe sex. Use a condom or other form of protection to prevent STIs. Take aspirin only as told by your health care provider. Make sure that you understand how much to take and what form to take. Work with your health care provider to find out whether it is safe   and beneficial for you to take aspirin daily. Ask your health care provider if you need to take a cholesterol-lowering medicine (statin). Find healthy ways to manage stress, such  as: Meditation, yoga, or listening to music. Journaling. Talking to a trusted person. Spending time with friends and family. Safety Always wear your seat belt while driving or riding in a vehicle. Do not drive: If you have been drinking alcohol. Do not ride with someone who has been drinking. When you are tired or distracted. While texting. If you have been using any mind-altering substances or drugs. Wear a helmet and other protective equipment during sports activities. If you have firearms in your house, make sure you follow all gun safety procedures. Minimize exposure to UV radiation to reduce your risk of skin cancer. What's next? Visit your health care provider once a year for an annual wellness visit. Ask your health care provider how often you should have your eyes and teeth checked. Stay up to date on all vaccines. This information is not intended to replace advice given to you by your health care provider. Make sure you discuss any questions you have with your health care provider. Document Revised: 10/28/2020 Document Reviewed: 10/28/2020 Elsevier Patient Education  2023 [employer]  

## 2022-03-22 NOTE — Progress Notes (Addendum)
Subjective:   By signing my name below, I, Timothy Huynh, attest that this documentation has been prepared under the direction and in the presence of Ann Held, DO. 03/22/2022    Patient ID: Timothy Huynh, male    DOB: July 03, 1955, 66 y.o.   MRN: [id number]  Chief Complaint  Patient presents with   Annual Exam    Pt states not fasting     HPI Patient is in today for a comprehensive physical exam.   He reports no new issues with his rheumatoid arthritis. He continues taking 15 mg methotrexate every Monday.  He reports a mold on his above his right clavicle for a while that has not changes and does not hurt.  He denies having any fever, new muscle pain, new joint pain, new moles, congestion, sinus pain, sore throat, chest pain, palpations, cough, SOB, wheezing, n/v/d, constipation, blood in stool, dysuria, frequency, hematuria, or headaches at this time. He has no changes to his family medical history.  He is UTD on flu vaccine this year. He is eligible for the pneumonia vaccines and is interested in receiving it.  He is participating in regular exercise by going to the gym or walking.  He is UTD on vision care. He is UTD on dental care.    Past Medical History:  Diagnosis Date   Allergy    Hyperlipidemia    Hypertension    RA (rheumatoid arthritis) ([place])     Past Surgical History:  Procedure Laterality Date   HERNIA REPAIR      Family History  Problem Relation Age of Onset   Cancer Father        Oral --angioplasty expired from cancer   Diabetes Father    Dementia Mother    Colon cancer Neg Hx    Esophageal cancer Neg Hx    Rectal cancer Neg Hx    Stomach cancer Neg Hx     Social History   Socioeconomic History   Marital status: Married    Spouse name: Not on file   Number of children: Not on file   Years of education: Not on file   Highest education level: Not on file  Occupational History   Occupation: TE Arts development officer: [employer]   Tobacco Use   Smoking status: Never   Smokeless tobacco: Never  Substance and Sexual Activity   Alcohol use: Yes   Drug use: No   Sexual activity: Yes    Partners: Female  Other Topics Concern   Not on file  Social History Narrative   Exercise--- 2-3 x a week   Social Determinants of Health   Financial Resource Strain: Not on file  Food Insecurity: Not on file  Transportation Needs: Not on file  Physical Activity: Not on file  Stress: Not on file  Social Connections: Not on file  Intimate Partner Violence: Not on file    Outpatient Medications Prior to Visit  Medication Sig Dispense Refill   atorvastatin (LIPITOR) 20 MG tablet TAKE 1 TABLET DAILY 90 tablet 1   celecoxib (CELEBREX) 100 MG capsule Take 1 capsule (100 mg total) by mouth daily. 90 capsule 3   folic acid (FOLVITE) 1 MG tablet Take 1 mg by mouth daily.     methotrexate (RHEUMATREX) 2.5 MG tablet Take 15 mg by mouth every Monday.      sildenafil (REVATIO) 20 MG tablet Take 1 tablet (20 mg total) by mouth 3 (three) times daily. 30 tablet 5  sildenafil (VIAGRA) 50 MG tablet Take 1 tablet (50 mg total) by mouth daily as needed for erectile dysfunction. 10 tablet 5   COVID-19 mRNA bivalent vaccine, Pfizer, (PFIZER COVID-19 VAC BIVALENT) injection Inject into the muscle. (Patient not taking: Reported on 03/22/2022) 0.3 mL 0   COVID-19 mRNA vaccine 2023-2024 (COMIRNATY) syringe Inject into the muscle. (Patient not taking: Reported on 03/22/2022) 0.3 mL 0   influenza vaccine adjuvanted (FLUAD QUADRIVALENT) 0.5 ML injection Inject into the muscle. (Patient not taking: Reported on 03/22/2022) 0.5 mL 0   No facility-administered medications prior to visit.    No Known Allergies  Review of Systems  Constitutional:  Negative for chills, fever and malaise/fatigue.  HENT:  Negative for congestion, hearing loss, sinus pain and sore throat.   Eyes:  Negative for discharge.  Respiratory:  Negative for cough, sputum production,  shortness of breath and wheezing.   Cardiovascular:  Negative for chest pain, palpitations and leg swelling.  Gastrointestinal:  Negative for abdominal pain, blood in stool, constipation, diarrhea, heartburn, nausea and vomiting.  Genitourinary:  Negative for dysuria, frequency, hematuria and urgency.  Musculoskeletal:  Negative for back pain, falls and myalgias.       (-)new muscle pain (-)new joint pain  Skin:  Negative for rash.       (-)New moles  Neurological:  Negative for dizziness, sensory change, loss of consciousness, weakness and headaches.  Endo/Heme/Allergies:  Negative for environmental allergies. Does not bruise/bleed easily.  Psychiatric/Behavioral:  Negative for depression and suicidal ideas. The patient is not nervous/anxious and does not have insomnia.        Objective:    Physical Exam Vitals and nursing note reviewed.  Constitutional:      General: He is not in acute distress.    Appearance: Normal appearance. He is not ill-appearing.  HENT:     Head: Normocephalic and atraumatic.     Right Ear: Tympanic membrane, ear canal and external ear normal.     Left Ear: Tympanic membrane, ear canal and external ear normal.     Nose: Nose normal.     Mouth/Throat:     Mouth: Mucous membranes are moist.     Pharynx: Oropharynx is clear. No oropharyngeal exudate or posterior oropharyngeal erythema.  Eyes:     Extraocular Movements: Extraocular movements intact.     Conjunctiva/sclera: Conjunctivae normal.     Pupils: Pupils are equal, round, and reactive to light.  Cardiovascular:     Rate and Rhythm: Normal rate and regular rhythm.     Heart sounds: Normal heart sounds. No murmur heard.    No gallop.  Pulmonary:     Effort: Pulmonary effort is normal. No respiratory distress.     Breath sounds: Normal breath sounds. No wheezing or rales.  Abdominal:     General: Bowel sounds are normal. There is no distension.     Palpations: Abdomen is soft.     Tenderness:  There is no abdominal tenderness. There is no guarding.  Musculoskeletal:        General: Normal range of motion.     Cervical back: Normal range of motion and neck supple.  Skin:    General: Skin is warm and dry.  Neurological:     General: No focal deficit present.     Mental Status: He is alert and oriented to person, place, and time.  Psychiatric:        Mood and Affect: Mood normal.        Behavior:  Behavior normal.        Thought Content: Thought content normal.        Judgment: Judgment normal.     BP 120/88 (BP Location: Right Arm, Patient Position: Sitting, Cuff Size: Normal)   Pulse 64   Temp 98.4 F (36.9 C) (Oral)   Resp 18   Ht '5\' 7"'$  (1.702 m)   Wt 173 lb 3.2 oz (78.6 kg)   SpO2 98%   BMI 27.13 kg/m  Wt Readings from Last 3 Encounters:  03/22/22 173 lb 3.2 oz (78.6 kg)  03/19/21 174 lb (78.9 kg)  03/16/20 167 lb 3.2 oz (75.8 kg)    Diabetic Foot Exam - Simple   No data filed    Lab Results  Component Value Date   WBC 5.3 03/19/2021   HGB 13.8 03/19/2021   HCT 40.1 03/19/2021   PLT 315 03/19/2021   GLUCOSE 91 03/19/2021   CHOL 142 03/19/2021   TRIG 103 03/19/2021   HDL 51 03/19/2021   LDLDIRECT 123.0 03/14/2016   LDLCALC 72 03/19/2021   ALT 38 03/19/2021   AST 19 03/19/2021   NA 138 03/19/2021   K 4.3 03/19/2021   CL 101 03/19/2021   CREATININE 0.95 03/19/2021   BUN 12 03/19/2021   CO2 26 03/19/2021   TSH 2.65 03/19/2021   PSA 1.52 03/19/2021   INR 1.0 09/11/2007   HGBA1C 5.8 06/16/2016   MICROALBUR 0.7 09/27/2012    Lab Results  Component Value Date   TSH 2.65 03/19/2021   Lab Results  Component Value Date   WBC 5.3 03/19/2021   HGB 13.8 03/19/2021   HCT 40.1 03/19/2021   MCV 94.8 03/19/2021   PLT 315 03/19/2021   Lab Results  Component Value Date   NA 138 03/19/2021   K 4.3 03/19/2021   CO2 26 03/19/2021   GLUCOSE 91 03/19/2021   BUN 12 03/19/2021   CREATININE 0.95 03/19/2021   BILITOT 0.8 03/19/2021   ALKPHOS 66  04/22/2019   AST 19 03/19/2021   ALT 38 03/19/2021   PROT 7.1 03/19/2021   ALBUMIN 4.6 04/22/2019   CALCIUM 9.8 03/19/2021   ANIONGAP 10 03/16/2016   GFR 96.43 04/22/2019   Lab Results  Component Value Date   CHOL 142 03/19/2021   Lab Results  Component Value Date   HDL 51 03/19/2021   Lab Results  Component Value Date   LDLCALC 72 03/19/2021   Lab Results  Component Value Date   TRIG 103 03/19/2021   Lab Results  Component Value Date   CHOLHDL 2.8 03/19/2021   Lab Results  Component Value Date   HGBA1C 5.8 06/16/2016   PSA: Last completed 03/19/2021. Results showed 2.65.  Colonoscopy: Last completed 09/19/2019. Results showed: - The examined portion of the ileum was normal. - Diverticulosis in the left colon. - The examination was otherwise normal on direct and retroflexion views. - No specimens collected. - repeat in 10 years.      Assessment & Plan:   Problem List Items Addressed This Visit       Unprioritized   Rheumatoid arthritis involving multiple sites with positive rheumatoid factor ([place])    Per rheum      Preventative health care - Primary    Ghm utd Check labs  See avs      Relevant Orders   Comprehensive metabolic panel   CBC with Differential/Platelet   Lipid panel   Hyperlipidemia LDL goal <100    Encourage heart healthy  diet such as MIND or DASH diet, increase exercise, avoid trans fats, simple carbohydrates and processed foods, consider a krill or fish or flaxseed oil cap daily.        Hyperlipidemia   Relevant Orders   Comprehensive metabolic panel   CBC with Differential/Platelet   Lipid panel   Erectile dysfunction   Relevant Orders   Comprehensive metabolic panel   CBC with Differential/Platelet   Lipid panel   Other Visit Diagnoses     Need for pneumococcal vaccination       Relevant Orders   Pneumococcal conjugate vaccine 20-valent (Prevnar 20) (Completed)        No orders of the defined types were placed in  this encounter.   IAnn Held, DO, personally preformed the services described in this documentation.  All medical record entries made by the scribe were at my direction and in my presence.  I have reviewed the chart and discharge instructions (if applicable) and agree that the record reflects my personal performance and is accurate and complete. 03/22/2022   I,Timothy Huynh,acting as a scribe for Ann Held, DO.,have documented all relevant documentation on the behalf of Ann Held, DO,as directed by  Ann Held, DO while in the presence of Ann Held, DO.   Ann Held, DO

## 2022-03-22 NOTE — Assessment & Plan Note (Signed)
Ghm utd Check labs  See avs 

## 2022-03-22 NOTE — Assessment & Plan Note (Signed)
Encourage heart healthy diet such as MIND or DASH diet, increase exercise, avoid trans fats, simple carbohydrates and processed foods, consider a krill or fish or flaxseed oil cap daily.  °

## 2022-03-22 NOTE — Assessment & Plan Note (Signed)
Per rheum 

## 2022-04-02 ENCOUNTER — Other Ambulatory Visit: Payer: Self-pay | Admitting: Family Medicine

## 2022-04-02 DIAGNOSIS — M5441 Lumbago with sciatica, right side: Secondary | ICD-10-CM

## 2022-07-27 ENCOUNTER — Other Ambulatory Visit: Payer: Self-pay | Admitting: Family Medicine

## 2022-07-27 DIAGNOSIS — E785 Hyperlipidemia, unspecified: Secondary | ICD-10-CM

## 2022-10-28 ENCOUNTER — Other Ambulatory Visit: Payer: Self-pay | Admitting: Family Medicine

## 2022-10-28 DIAGNOSIS — M5441 Lumbago with sciatica, right side: Secondary | ICD-10-CM

## 2022-12-09 ENCOUNTER — Telehealth: Payer: Self-pay | Admitting: Family Medicine

## 2022-12-09 DIAGNOSIS — M5441 Lumbago with sciatica, right side: Secondary | ICD-10-CM

## 2022-12-09 MED ORDER — CELECOXIB 100 MG PO CAPS: 100.0000 mg | ORAL_CAPSULE | Freq: Every day | ORAL | 1 refills | Status: DC

## 2022-12-09 NOTE — Telephone Encounter (Signed)
Medication: celecoxib (CELEBREX) 100 MG capsule  Has the patient contacted their pharmacy? No.   Preferred Pharmacy:  [facility] - [address] [place] - [address] [place] [address] Phone: [phone]  Fax: [phone]

## 2022-12-09 NOTE — Telephone Encounter (Signed)
Rx sent. Ov on 12/20/22

## 2022-12-20 ENCOUNTER — Ambulatory Visit (INDEPENDENT_AMBULATORY_CARE_PROVIDER_SITE_OTHER): Payer: BC Managed Care – PPO | Admitting: Family Medicine

## 2022-12-20 ENCOUNTER — Encounter: Payer: Self-pay | Admitting: Family Medicine

## 2022-12-20 ENCOUNTER — Other Ambulatory Visit (HOSPITAL_BASED_OUTPATIENT_CLINIC_OR_DEPARTMENT_OTHER): Payer: Self-pay

## 2022-12-20 VITALS — BP 138/80 | HR 61 | Temp 97.8°F | Resp 18 | Ht 67.0 in | Wt 169.2 lb

## 2022-12-20 DIAGNOSIS — M0579 Rheumatoid arthritis with rheumatoid factor of multiple sites without organ or systems involvement: Secondary | ICD-10-CM

## 2022-12-20 DIAGNOSIS — E785 Hyperlipidemia, unspecified: Secondary | ICD-10-CM | POA: Diagnosis not present

## 2022-12-20 DIAGNOSIS — Z0000 Encounter for general adult medical examination without abnormal findings: Secondary | ICD-10-CM | POA: Diagnosis not present

## 2022-12-20 DIAGNOSIS — N529 Male erectile dysfunction, unspecified: Secondary | ICD-10-CM

## 2022-12-20 LAB — CBC WITH DIFFERENTIAL/PLATELET
Basophils Absolute: 0 10*3/uL (ref 0.0–0.1)
Basophils Relative: 0.2 % (ref 0.0–3.0)
Eosinophils Absolute: 0.2 10*3/uL (ref 0.0–0.7)
Eosinophils Relative: 4.1 % (ref 0.0–5.0)
HCT: 43.9 % (ref 39.0–52.0)
Hemoglobin: 14.5 g/dL (ref 13.0–17.0)
Lymphocytes Relative: 13.8 % (ref 12.0–46.0)
Lymphs Abs: 0.7 10*3/uL (ref 0.7–4.0)
MCHC: 33 g/dL (ref 30.0–36.0)
MCV: 96.6 fl (ref 78.0–100.0)
Monocytes Absolute: 0.5 10*3/uL (ref 0.1–1.0)
Monocytes Relative: 8.7 % (ref 3.0–12.0)
Neutro Abs: 3.8 10*3/uL (ref 1.4–7.7)
Neutrophils Relative %: 73.2 % (ref 43.0–77.0)
Platelets: 354 10*3/uL (ref 150.0–400.0)
RBC: 4.54 Mil/uL (ref 4.22–5.81)
RDW: 13.5 % (ref 11.5–15.5)
WBC: 5.2 10*3/uL (ref 4.0–10.5)

## 2022-12-20 LAB — LIPID PANEL
Cholesterol: 159 mg/dL (ref 0–200)
HDL: 54.8 mg/dL (ref >39.00)
LDL Cholesterol: 79 mg/dL (ref 0–99)
NonHDL: 104.58
Total CHOL/HDL Ratio: 3
Triglycerides: 130 mg/dL (ref 0.0–149.0)
VLDL: 26 mg/dL (ref 0.0–40.0)

## 2022-12-20 LAB — COMPREHENSIVE METABOLIC PANEL
ALT: 43 U/L (ref 0–53)
AST: 27 U/L (ref 0–37)
Albumin: 4.8 g/dL (ref 3.5–5.2)
Alkaline Phosphatase: 75 U/L (ref 39–117)
BUN: 14 mg/dL (ref 6–23)
CO2: 26 mEq/L (ref 19–32)
Calcium: 9.8 mg/dL (ref 8.4–10.5)
Chloride: 102 mEq/L (ref 96–112)
Creatinine, Ser: 0.84 mg/dL (ref 0.40–1.50)
GFR: 91.15 mL/min (ref >60.00)
Glucose, Bld: 116 mg/dL — ABNORMAL HIGH (ref 70–99)
Potassium: 4.6 mEq/L (ref 3.5–5.1)
Sodium: 138 mEq/L (ref 135–145)
Total Bilirubin: 0.7 mg/dL (ref 0.2–1.2)
Total Protein: 7.3 g/dL (ref 6.0–8.3)

## 2022-12-20 LAB — PSA: PSA: 2.23 ng/mL (ref 0.10–4.00)

## 2022-12-20 LAB — TSH: TSH: 2.98 u[IU]/mL (ref 0.35–5.50)

## 2022-12-20 MED ORDER — SILDENAFIL CITRATE 20 MG PO TABS
ORAL_TABLET | ORAL | 5 refills | Status: AC
  Filled 2022-12-20: qty 90, 30d supply, fill #0

## 2022-12-20 NOTE — Patient Instructions (Signed)
Preventive Care 65 Years and Older, Male Preventive care refers to lifestyle choices and visits with your health care provider that can promote health and wellness. Preventive care visits are also called wellness exams. What can I expect for my preventive care visit? Counseling During your preventive care visit, your health care provider may ask about your: Medical history, including: Past medical problems. Family medical history. History of falls. Current health, including: Emotional well-being. Home life and relationship well-being. Sexual activity. Memory and ability to understand (cognition). Lifestyle, including: Alcohol, nicotine or tobacco, and drug use. Access to firearms. Diet, exercise, and sleep habits. Work and work environment. Sunscreen use. Safety issues such as seatbelt and bike helmet use. Physical exam Your health care provider will check your: Height and weight. These may be used to calculate your BMI (body mass index). BMI is a measurement that tells if you are at a healthy weight. Waist circumference. This measures the distance around your waistline. This measurement also tells if you are at a healthy weight and may help predict your risk of certain diseases, such as type 2 diabetes and high blood pressure. Heart rate and blood pressure. Body temperature. Skin for abnormal spots. What immunizations do I need?  Vaccines are usually given at various ages, according to a schedule. Your health care provider will recommend vaccines for you based on your age, medical history, and lifestyle or other factors, such as travel or where you work. What tests do I need? Screening Your health care provider may recommend screening tests for certain conditions. This may include: Lipid and cholesterol levels. Diabetes screening. This is done by checking your blood sugar (glucose) after you have not eaten for a while (fasting). Hepatitis C test. Hepatitis B test. HIV (human  immunodeficiency virus) test. STI (sexually transmitted infection) testing, if you are at risk. Lung cancer screening. Colorectal cancer screening. Prostate cancer screening. Abdominal aortic aneurysm (AAA) screening. You may need this if you are a current or former smoker. Talk with your health care provider about your test results, treatment options, and if necessary, the need for more tests. Follow these instructions at home: Eating and drinking  Eat a diet that includes fresh fruits and vegetables, whole grains, lean protein, and low-fat dairy products. Limit your intake of foods with high amounts of sugar, saturated fats, and salt. Take vitamin and mineral supplements as recommended by your health care provider. Do not drink alcohol if your health care provider tells you not to drink. If you drink alcohol: Limit how much you have to 0-2 drinks a day. Know how much alcohol is in your drink. In the [place] one drink equals one 12 oz bottle of beer (355 mL), one 5 oz glass of wine (148 mL), or one 1 oz glass of hard liquor (44 mL). Lifestyle Brush your teeth every morning and night with fluoride toothpaste. Floss one time each day. Exercise for at least 30 minutes 5 or more days each week. Do not use any products that contain nicotine or tobacco. These products include cigarettes, chewing tobacco, and vaping devices, such as e-cigarettes. If you need help quitting, ask your health care provider. Do not use drugs. If you are sexually active, practice safe sex. Use a condom or other form of protection to prevent STIs. Take aspirin only as told by your health care provider. Make sure that you understand how much to take and what form to take. Work with your health care provider to find out whether it is safe   and beneficial for you to take aspirin daily. Ask your health care provider if you need to take a cholesterol-lowering medicine (statin). Find healthy ways to manage stress, such  as: Meditation, yoga, or listening to music. Journaling. Talking to a trusted person. Spending time with friends and family. Safety Always wear your seat belt while driving or riding in a vehicle. Do not drive: If you have been drinking alcohol. Do not ride with someone who has been drinking. When you are tired or distracted. While texting. If you have been using any mind-altering substances or drugs. Wear a helmet and other protective equipment during sports activities. If you have firearms in your house, make sure you follow all gun safety procedures. Minimize exposure to UV radiation to reduce your risk of skin cancer. What's next? Visit your health care provider once a year for an annual wellness visit. Ask your health care provider how often you should have your eyes and teeth checked. Stay up to date on all vaccines. This information is not intended to replace advice given to you by your health care provider. Make sure you discuss any questions you have with your health care provider. Document Revised: 10/28/2020 Document Reviewed: 10/28/2020 Elsevier Patient Education  2024 [employer]  

## 2022-12-20 NOTE — Progress Notes (Signed)
Established Patient Office Visit  Subjective   Patient ID: Timothy Huynh, male    DOB: 1955/10/16  Age: 67 y.o. MRN: [id number]  Chief Complaint  Patient presents with   Annual Exam    Pt states fasting     HPI Discussed the use of AI scribe software for clinical note transcription with the patient, who gave verbal consent to proceed.  History of Present Illness   The patient presents for a routine physical exam and medication refills. He reports a recent possible exposure to COVID-19 and experienced symptoms of congestion and fatigue, but did not get tested. He has since recovered. He also reports that sildenafil 20mg  is not working well for him and he is interested in increasing the dose.  The patient has been managing his arthritis with methotrexate and Celebrex, which he reports has been effective. He recently ran out of Celebrex and is awaiting a refill from [facility]. He also has a prescription for Lipitor for cholesterol management.  He has been experiencing some sinus pressure on the right side for several months, which he has been managing with over-the-counter Allertec. He also reports having a few ganglion cysts on his fingers, which are not causing any discomfort.  The patient is active, recently participating in a golf tournament. He exercises two to three times a week. He has regular eye exams and dental check-ups. He last received a COVID-19 vaccine in October.      Patient Active Problem List   Diagnosis Date Noted   Hyperlipidemia 04/22/2019   Erectile dysfunction 04/22/2019   Rheumatoid arthritis involving multiple sites with positive rheumatoid factor (HCC) 03/14/2016   Preventative health care 03/14/2016   Multiple joint pain 02/11/2013   Sinusitis 05/24/2012   ALLERGY UNSPECIFIED NOT ELSEWHERE CLASSIFIED 03/03/2010   NEVI, MULTIPLE 02/17/2010   ERECTILE DYSFUNCTION, NON-ORGANIC 02/17/2010   LOW BACK PAIN, ACUTE 12/14/2009   SKIN RASH 12/22/2008    Hyperlipidemia LDL goal <100 08/29/2008   ELEVATED BLOOD PRESSURE WITHOUT DIAGNOSIS OF HYPERTENSION 09/18/2007   SYNCOPE 09/11/2007   URTICARIA 07/06/2007   HERNIORRHAPHY, HX OF 07/06/2007   Past Medical History:  Diagnosis Date   Allergy    Hyperlipidemia    Hypertension    RA (rheumatoid arthritis) (HCC)    Past Surgical History:  Procedure Laterality Date   HERNIA REPAIR     Social History   Tobacco Use   Smoking status: Never   Smokeless tobacco: Never  Substance Use Topics   Alcohol use: Yes   Drug use: No   Social History   Socioeconomic History   Marital status: Married    Spouse name: Not on file   Number of children: Not on file   Years of education: Not on file   Highest education level: Not on file  Occupational History   Occupation: TE Psychologist, prison and probation services: [employer]  Tobacco Use   Smoking status: Never   Smokeless tobacco: Never  Substance and Sexual Activity   Alcohol use: Yes   Drug use: No   Sexual activity: Yes    Partners: Female  Other Topics Concern   Not on file  Social History Narrative   Exercise--- 2-3 x a week   Social Determinants of Health   Financial Resource Strain: Not on file  Food Insecurity: Not on file  Transportation Needs: Not on file  Physical Activity: Not on file  Stress: Not on file  Social Connections: Not on file  Intimate Partner Violence: Not  on file   Family Status  Relation Name Status   Father  Deceased at age 8   Mother  Deceased at age 30   Sister  Alive   Brother  Alive   Sister  Alive   Neg Hx  (Not Specified)  No partnership data on file   Family History  Problem Relation Age of Onset   Cancer Father        Oral --angioplasty expired from cancer   Diabetes Father    Dementia Mother    Colon cancer Neg Hx    Esophageal cancer Neg Hx    Rectal cancer Neg Hx    Stomach cancer Neg Hx    No Known Allergies    Review of Systems  Constitutional:  Negative for fever and  malaise/fatigue.  HENT:  Negative for congestion.   Eyes:  Negative for blurred vision.  Respiratory:  Negative for cough and shortness of breath.   Cardiovascular:  Negative for chest pain, palpitations and leg swelling.  Gastrointestinal:  Negative for abdominal pain, blood in stool, nausea and vomiting.  Genitourinary:  Negative for dysuria and frequency.  Musculoskeletal:  Negative for back pain and falls.  Skin:  Negative for rash.  Neurological:  Negative for dizziness, loss of consciousness and headaches.  Endo/Heme/Allergies:  Negative for environmental allergies.  Psychiatric/Behavioral:  Negative for depression. The patient is not nervous/anxious.       Objective:     BP 138/80 (BP Location: Left Arm, Patient Position: Sitting, Cuff Size: Normal)   Pulse 61   Temp 97.8 F (36.6 C) (Oral)   Resp 18   Ht 5\' 7"  (1.702 m)   Wt 169 lb 3.2 oz (76.7 kg)   SpO2 98%   BMI 26.50 kg/m  BP Readings from Last 3 Encounters:  12/20/22 138/80  03/22/22 120/88  03/19/21 130/80   Wt Readings from Last 3 Encounters:  12/20/22 169 lb 3.2 oz (76.7 kg)  03/22/22 173 lb 3.2 oz (78.6 kg)  03/19/21 174 lb (78.9 kg)   SpO2 Readings from Last 3 Encounters:  12/20/22 98%  03/22/22 98%  03/19/21 99%      Physical Exam Vitals and nursing note reviewed.  Constitutional:      General: He is not in acute distress.    Appearance: Normal appearance. He is well-developed.  HENT:     Head: Normocephalic and atraumatic.     Right Ear: Tympanic membrane, ear canal and external ear normal. There is no impacted cerumen.     Left Ear: Tympanic membrane, ear canal and external ear normal. There is no impacted cerumen.     Nose: Nose normal.     Mouth/Throat:     Mouth: Mucous membranes are moist.     Pharynx: Oropharynx is clear. No oropharyngeal exudate or posterior oropharyngeal erythema.  Eyes:     General: No scleral icterus.       Right eye: No discharge.        Left eye: No  discharge.     Conjunctiva/sclera: Conjunctivae normal.     Pupils: Pupils are equal, round, and reactive to light.  Neck:     Thyroid: No thyromegaly.     Vascular: No JVD.  Cardiovascular:     Rate and Rhythm: Normal rate and regular rhythm.     Heart sounds: Normal heart sounds. No murmur heard. Pulmonary:     Effort: Pulmonary effort is normal. No respiratory distress.     Breath sounds: Normal breath  sounds.  Abdominal:     General: Bowel sounds are normal. There is no distension.     Palpations: Abdomen is soft. There is no mass.     Tenderness: There is no abdominal tenderness. There is no guarding or rebound.  Musculoskeletal:        General: Normal range of motion.     Cervical back: Normal range of motion and neck supple.     Right lower leg: No edema.     Left lower leg: No edema.     Comments: Mult ganglion cysts in palm of hand  Lymphadenopathy:     Cervical: No cervical adenopathy.  Skin:    General: Skin is warm and dry.     Findings: No erythema or rash.  Neurological:     General: No focal deficit present.     Mental Status: He is alert and oriented to person, place, and time.     Cranial Nerves: No cranial nerve deficit.     Motor: No abnormal muscle tone.     Deep Tendon Reflexes: Reflexes are normal and symmetric. Reflexes normal.  Psychiatric:        Mood and Affect: Mood normal.        Behavior: Behavior normal.        Thought Content: Thought content normal.        Judgment: Judgment normal.      No results found for any visits on 12/20/22.  Last CBC Lab Results  Component Value Date   WBC 5.3 03/22/2022   HGB 13.7 03/22/2022   HCT 41.1 03/22/2022   MCV 95.2 03/22/2022   MCH 32.6 03/19/2021   RDW 13.0 03/22/2022   PLT 367.0 03/22/2022   Last metabolic panel Lab Results  Component Value Date   GLUCOSE 87 03/22/2022   NA 140 03/22/2022   K 4.7 03/22/2022   CL 102 03/22/2022   CO2 31 03/22/2022   BUN 16 03/22/2022   CREATININE 1.05  03/22/2022   GFR 74.58 03/22/2022   CALCIUM 9.8 03/22/2022   PROT 6.8 03/22/2022   ALBUMIN 4.5 03/22/2022   BILITOT 0.4 03/22/2022   ALKPHOS 74 03/22/2022   AST 20 03/22/2022   ALT 36 03/22/2022   ANIONGAP 10 03/16/2016   Last lipids Lab Results  Component Value Date   CHOL 141 03/22/2022   HDL 47.30 03/22/2022   LDLCALC 62 03/22/2022   LDLDIRECT 123.0 03/14/2016   TRIG 161.0 (H) 03/22/2022   CHOLHDL 3 03/22/2022   Last hemoglobin A1c Lab Results  Component Value Date   HGBA1C 5.8 06/16/2016   Last thyroid functions Lab Results  Component Value Date   TSH 2.65 03/19/2021   Last vitamin D No results found for: "25OHVITD2", "25OHVITD3", "VD25OH" Last vitamin B12 and Folate No results found for: "VITAMINB12", "FOLATE"    The 10-year ASCVD risk score (Arnett DK, et al., 2019) is: 12.7%    Assessment & Plan:   Problem List Items Addressed This Visit       Unprioritized   Erectile dysfunction   Relevant Medications   sildenafil (REVATIO) 20 MG tablet   Other Relevant Orders   PSA   Hyperlipidemia - Primary   Relevant Medications   sildenafil (REVATIO) 20 MG tablet   Other Relevant Orders   Lipid panel   PSA   TSH   Comprehensive metabolic panel   CBC with Differential/Platelet   Rheumatoid arthritis involving multiple sites with positive rheumatoid factor (HCC)   Relevant Orders  TSH   Comprehensive metabolic panel   CBC with Differential/Platelet   Preventative health care    Ghm utd Check labs  See AVS Health Maintenance  Topic Date Due   COVID-19 Vaccine (7 - 2023-24 season) 07/04/2022   INFLUENZA VACCINE  12/15/2022   DTaP/Tdap/Td (4 - Td or Tdap) 03/16/2030   Pneumonia Vaccine 42+ Years old  Completed   Hepatitis C Screening  Completed   Zoster Vaccines- Shingrix  Completed   HPV VACCINES  Aged Out   Colonoscopy  Discontinued         Assessment and Plan    Erectile Dysfunction Sildenafil 20mg  not effective. Discussed increasing  dose due to comparison with typical Viagra dosing. -Increase Sildenafil to 40mg  as needed.  Rheumatoid Arthritis Well controlled on Methotrexate. Celebrex for back inflammation, currently awaiting refill from [facility]. -Continue Methotrexate as prescribed by rheumatologist. -Continue Celebrex once received.  Hyperlipidemia No issues reported with Lipitor. -Continue Lipitor as prescribed.  Allergic Rhinitis Right-sided sinus pressure, currently taking over-the-counter Allertec. -Continue Allertec. -Add regular use of Nasacort or Astepro.  Ganglion Cysts Two new cysts on hand, not causing discomfort. -No intervention needed at this time unless symptoms develop.  General Health Maintenance -Continue regular exercise 2-3 times per week. -Regular eye exams with local optometrist. -Regular dental check-ups. -Continue regular follow-ups with Dr. Dierdre Forth (rheumatologist). -Check labs today. -Flu shots not yet available, discuss at next visit.        Return in about 6 months (around 06/22/2023), or if symptoms worsen or fail to improve, for hyperlipidemia.    Donato Schultz, DO

## 2022-12-20 NOTE — Assessment & Plan Note (Signed)
Ghm utd Check labs  See AVS Health Maintenance  Topic Date Due   COVID-19 Vaccine (7 - 2023-24 season) 07/04/2022   INFLUENZA VACCINE  12/15/2022   DTaP/Tdap/Td (4 - Td or Tdap) 03/16/2030   Pneumonia Vaccine 24+ Years old  Completed   Hepatitis C Screening  Completed   Zoster Vaccines- Shingrix  Completed   HPV VACCINES  Aged Out   Colonoscopy  Discontinued

## 2022-12-21 ENCOUNTER — Other Ambulatory Visit: Payer: Self-pay | Admitting: Family Medicine

## 2022-12-21 DIAGNOSIS — R972 Elevated prostate specific antigen [PSA]: Secondary | ICD-10-CM

## 2023-01-30 ENCOUNTER — Other Ambulatory Visit (HOSPITAL_BASED_OUTPATIENT_CLINIC_OR_DEPARTMENT_OTHER): Payer: Self-pay

## 2023-01-30 MED ORDER — COMIRNATY 30 MCG/0.3ML IM SUSY
0.3000 mL | PREFILLED_SYRINGE | Freq: Once | INTRAMUSCULAR | 0 refills | Status: DC
  Filled 2023-01-30: qty 0.3, 1d supply, fill #0

## 2023-01-30 MED ORDER — FLUAD 0.5 ML IM SUSY
0.5000 mL | PREFILLED_SYRINGE | Freq: Once | INTRAMUSCULAR | 0 refills | Status: DC
  Filled 2023-01-30: qty 0.5, 1d supply, fill #0

## 2023-01-31 ENCOUNTER — Other Ambulatory Visit (HOSPITAL_BASED_OUTPATIENT_CLINIC_OR_DEPARTMENT_OTHER): Payer: Self-pay

## 2023-03-23 ENCOUNTER — Telehealth: Payer: Self-pay | Admitting: Family Medicine

## 2023-03-23 DIAGNOSIS — E785 Hyperlipidemia, unspecified: Secondary | ICD-10-CM

## 2023-03-23 DIAGNOSIS — M5441 Lumbago with sciatica, right side: Secondary | ICD-10-CM

## 2023-03-23 NOTE — Telephone Encounter (Signed)
Patient called and stated that he is needing refills for atorvastatin (LIPITOR) 20 MG tablet and celecoxib (CELEBREX) 100 MG capsule. He mentioned that he recently switched pharmacies and will be using [facility] - [place]  [address], [place], [place] [address] Phone: [phone]. Please call and advise pt.

## 2023-03-24 MED ORDER — CELECOXIB 100 MG PO CAPS
100.0000 mg | ORAL_CAPSULE | Freq: Every day | ORAL | 1 refills | Status: DC
Start: 2023-03-24 — End: 2023-10-11

## 2023-03-24 MED ORDER — ATORVASTATIN CALCIUM 20 MG PO TABS: ORAL_TABLET | ORAL | 1 refills | Status: DC

## 2023-03-24 NOTE — Addendum Note (Signed)
Addended by: Roxanne Gates on: 03/24/2023 09:18 AM   Modules accepted: Orders

## 2023-03-24 NOTE — Telephone Encounter (Signed)
Rx sent 

## 2023-03-29 DIAGNOSIS — R69 Illness, unspecified: Secondary | ICD-10-CM | POA: Diagnosis not present

## 2023-03-31 ENCOUNTER — Encounter (HOSPITAL_BASED_OUTPATIENT_CLINIC_OR_DEPARTMENT_OTHER): Payer: Self-pay | Admitting: Student

## 2023-03-31 ENCOUNTER — Ambulatory Visit (HOSPITAL_BASED_OUTPATIENT_CLINIC_OR_DEPARTMENT_OTHER): Payer: Medicare HMO

## 2023-03-31 ENCOUNTER — Ambulatory Visit (HOSPITAL_BASED_OUTPATIENT_CLINIC_OR_DEPARTMENT_OTHER): Payer: Medicare HMO | Admitting: Student

## 2023-03-31 DIAGNOSIS — S8002XA Contusion of left knee, initial encounter: Secondary | ICD-10-CM | POA: Diagnosis not present

## 2023-03-31 DIAGNOSIS — M25562 Pain in left knee: Secondary | ICD-10-CM | POA: Diagnosis not present

## 2023-03-31 NOTE — Progress Notes (Signed)
Chief Complaint: Left knee pain     History of Present Illness:    Timothy Huynh is a 67 y.o. male presenting today with 4-week history of left knee pain.  Patient states that this began due to a fall on hardwood floors directly onto the medial side of the knee.  Immediately after he was unable to weight-bear until a few days later.  The knee has been swollen however this has improved over the last few weeks.  He still reports moderate amount of pain over the medial knee.  This is often described as sharp and hurt towards the knee is strained.  Has been icing and takes Celebrex daily for arthritis.  Denies any other treatments at this time.  No previous knee injuries.  He enjoys playing golf.   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[employer]  Tobacco Use   Smoking status: Never   Smokeless tobacco: Never  Substance and Sexual Activity   Alcohol use: Yes   Drug use: No   Sexual activity: Yes    Partners: Female  Other Topics Concern   Not on file  Social History Narrative   Exercise--- 2-3 x a week   Social Determinants of Health   Financial Resource Strain: Not on file  Food Insecurity: Not on file  Transportation Needs: Not on file  Physical Activity: Not on file  Stress: Not on file  Social Connections: Not on file   Family History  Problem Relation Age of Onset   Cancer Father        Oral --angioplasty expired from cancer    Diabetes Father    Dementia Mother    Colon cancer Neg Hx    Esophageal cancer Neg Hx    Rectal cancer Neg Hx    Stomach cancer Neg Hx    No Known Allergies Current Outpatient Medications  Medication Sig Dispense Refill   atorvastatin (LIPITOR) 20 MG tablet TAKE 1 TABLET DAILY. 90 tablet 1   celecoxib (CELEBREX) 100 MG capsule Take 1 capsule (100 mg total) by mouth daily. 90 capsule 1   folic acid (FOLVITE) 1 MG tablet Take 1 mg by mouth daily.     methotrexate (RHEUMATREX) 2.5 MG tablet Take 15 mg by mouth every Monday.      sildenafil (REVATIO) 20 MG tablet Take 3 tablets by mouth daily as needed. 90 tablet 5   No current facility-administered medications for this visit.   No results found.  Review of Systems:   A ROS was performed including pertinent positives and negatives as documented in the HPI.  Physical Exam :   Constitutional: NAD and appears stated age Neurological: Alert and oriented Psych: Appropriate affect and cooperative There were no  vitals taken for this visit.   Comprehensive Musculoskeletal Exam:    No evidence of significant swelling, erythema, or ecchymosis of the left knee.  Bilateral joint lines nontender.  Pinpoint tenderness of the anterior medial femoral condyle.  Knee flexion and extension strength is 5/5.  No laxity or pain with varus or valgus stress.  Negative Lachman and posterior drawer.  Imaging:   Xray (left knee 4 views): No evidence of fracture or dislocation.  Mild tricompartmental degenerative changes.   I personally reviewed and interpreted the radiographs.   Assessment:   67 y.o. male with medial sided left knee pain due to a direct fall 4 weeks ago.  X-rays do not show any evidence of fracture.  Knee feels stable with ligamentous testing and I have low suspicion at this time for a meniscal injury.  Given the pinpoint area of his tenderness I believe this is most likely consistent with a bone contusion.  I do recommend continuing  with ice, rest, and anti-inflammatories.  Discussed that these injuries can take many weeks for recovery.  I would like him to continue monitoring symptoms and should he have little to no improvement in the next 3 to 4 weeks I can see him back for reassessment and potential further workup.  Plan :    - Return to clinic as needed     I personally saw and evaluated the patient, and participated in the management and treatment plan.  Hazle Nordmann, PA-C Orthopedics

## 2023-04-20 DIAGNOSIS — M0609 Rheumatoid arthritis without rheumatoid factor, multiple sites: Secondary | ICD-10-CM | POA: Diagnosis not present

## 2023-04-28 ENCOUNTER — Encounter (HOSPITAL_BASED_OUTPATIENT_CLINIC_OR_DEPARTMENT_OTHER): Payer: Self-pay

## 2023-04-28 ENCOUNTER — Encounter (HOSPITAL_BASED_OUTPATIENT_CLINIC_OR_DEPARTMENT_OTHER): Payer: Self-pay | Admitting: Student

## 2023-04-28 ENCOUNTER — Ambulatory Visit (HOSPITAL_BASED_OUTPATIENT_CLINIC_OR_DEPARTMENT_OTHER): Payer: Medicare HMO | Admitting: Student

## 2023-04-28 DIAGNOSIS — S8002XA Contusion of left knee, initial encounter: Secondary | ICD-10-CM

## 2023-04-28 MED ORDER — MELOXICAM 15 MG PO TABS: 15.0000 mg | ORAL_TABLET | Freq: Every day | ORAL | 0 refills | Status: AC

## 2023-04-28 NOTE — Progress Notes (Signed)
Chief Complaint: Left knee pain     History of Present Illness:   04/28/23: Patient presents today for follow-up of his left knee.  Overall he reports no significant improvement in his symptoms since the last visit 1 month ago.  Rates pain level in a 9/10 which she describes as an ache.  Does note some mild continued swelling.  Not taking any pain medication other than Celebrex.  Has been icing and resting when possible.   Timothy Huynh is a 67 y.o. male presenting today with 4-week history of left knee pain.  Patient states that this began due to a fall on hardwood floors directly onto the medial side of the knee.  Immediately after he was unable to weight-bear until a few days later.  The knee has been swollen however this has improved over the last few weeks.  He still reports moderate amount of pain over the medial knee.  This is often described as sharp and hurt towards the knee is strained.  Has been icing and takes Celebrex daily for arthritis.  Denies any other treatments at this time.  No previous knee injuries.  He enjoys playing golf.   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[employer]  Tobacco Use   Smoking status: Never   Smokeless tobacco: Never  Substance and Sexual Activity   Alcohol use: Yes   Drug use: No   Sexual activity: Yes    Partners: Female  Other Topics Concern   Not on file  Social History Narrative    Exercise--- 2-3 x a week   Social Drivers of Corporate investment banker Strain: Not on file  Food Insecurity: Not on file  Transportation Needs: Not on file  Physical Activity: Not on file  Stress: Not on file  Social Connections: Not on file   Family History  Problem Relation Age of Onset   Cancer Father        Oral --angioplasty expired from cancer   Diabetes Father    Dementia Mother    Colon cancer Neg Hx    Esophageal cancer Neg Hx    Rectal cancer Neg Hx    Stomach cancer Neg Hx    No Known Allergies Current Outpatient Medications  Medication Sig Dispense Refill   meloxicam (MOBIC) 15 MG tablet Take 1 tablet (15 mg total) by mouth daily for 14 days. 14 tablet 0   atorvastatin (LIPITOR) 20 MG tablet TAKE 1 TABLET DAILY. 90 tablet 1   celecoxib (CELEBREX) 100 MG capsule Take 1 capsule (100 mg total) by mouth daily. 90 capsule 1   folic acid (FOLVITE) 1 MG tablet Take 1 mg by mouth daily.     methotrexate (RHEUMATREX) 2.5 MG  tablet Take 15 mg by mouth every Monday.      sildenafil (REVATIO) 20 MG tablet Take 3 tablets by mouth daily as needed. 90 tablet 5   No current facility-administered medications for this visit.   No results found.  Review of Systems:   A ROS was performed including pertinent positives and negatives as documented in the HPI.  Physical Exam :   Constitutional: NAD and appears stated age Neurological: Alert and oriented Psych: Appropriate affect and cooperative There were no vitals taken for this visit.   Comprehensive Musculoskeletal Exam:    Left knee exam demonstrates tenderness directly over the medial femoral condyle.  No overlying erythema or ecchymosis.  Mild edema present.  Active range of motion from 0 to 130 degrees.  Knee flexion and extension strength 5/5.  Imaging:    Assessment:   67 y.o. male with left knee pain that has now been ongoing for the last 2 months.  This was brought on by direct fall onto the knee.  He reports no  improvement in pain or symptoms.  We did initially discuss that this is a likely contusion based on negative x-rays however at this point I am concerned for a more significant underlying injury, potentially a fracture given his mechanism.  I would like to proceed with an MRI scan of the left knee for further evaluation.  Will have him return shortly after for review and discussion of treatment plan.  Plan :    -Obtain MRI of the left knee and return to clinic for review     I personally saw and evaluated the patient, and participated in the management and treatment plan.  Hazle Nordmann, PA-C Orthopedics

## 2023-05-03 ENCOUNTER — Encounter (HOSPITAL_BASED_OUTPATIENT_CLINIC_OR_DEPARTMENT_OTHER): Payer: Self-pay | Admitting: Student

## 2023-05-12 ENCOUNTER — Ambulatory Visit
Admission: RE | Admit: 2023-05-12 | Discharge: 2023-05-12 | Disposition: A | Discharge status: Home / Self Care | Payer: Medicare HMO | Source: Ambulatory Visit | Attending: Student

## 2023-05-12 DIAGNOSIS — S8002XA Contusion of left knee, initial encounter: Secondary | ICD-10-CM

## 2023-05-12 DIAGNOSIS — M23632 Other spontaneous disruption of medial collateral ligament of left knee: Secondary | ICD-10-CM | POA: Diagnosis not present

## 2023-05-12 DIAGNOSIS — H52221 Regular astigmatism, right eye: Secondary | ICD-10-CM | POA: Diagnosis not present

## 2023-05-12 DIAGNOSIS — M25562 Pain in left knee: Secondary | ICD-10-CM | POA: Diagnosis not present

## 2023-05-12 DIAGNOSIS — H524 Presbyopia: Secondary | ICD-10-CM | POA: Diagnosis not present

## 2023-05-12 DIAGNOSIS — M23322 Other meniscus derangements, posterior horn of medial meniscus, left knee: Secondary | ICD-10-CM | POA: Diagnosis not present

## 2023-05-23 ENCOUNTER — Encounter (HOSPITAL_BASED_OUTPATIENT_CLINIC_OR_DEPARTMENT_OTHER): Payer: Self-pay

## 2023-05-24 ENCOUNTER — Ambulatory Visit (HOSPITAL_BASED_OUTPATIENT_CLINIC_OR_DEPARTMENT_OTHER): Payer: Medicare HMO | Admitting: Student

## 2023-05-24 ENCOUNTER — Encounter (HOSPITAL_BASED_OUTPATIENT_CLINIC_OR_DEPARTMENT_OTHER): Payer: Self-pay | Admitting: Student

## 2023-05-24 DIAGNOSIS — S8002XA Contusion of left knee, initial encounter: Secondary | ICD-10-CM

## 2023-05-24 MED ORDER — MELOXICAM 15 MG PO TABS: 15.0000 mg | ORAL_TABLET | Freq: Every day | ORAL | 0 refills | Status: AC

## 2023-05-24 NOTE — Progress Notes (Signed)
 Chief Complaint: Left knee pain     History of Present Illness:   05/24/23: Patient presents today for MRI review of the left knee.  Overall he reports his symptoms have not significantly changed.  Continues to have pain on the medial side of the knee which is not exacerbated when weightbearing.  He does have increased pain when lying flat.  Denies taking any pain medications.   Timothy Huynh is a 68 y.o. male presenting today with 4-week history of left knee pain.  Patient states that this began due to a fall on hardwood floors directly onto the medial side of the knee.  Immediately after he was unable to weight-bear until a few days later.  The knee has been swollen however this has improved over the last few weeks.  He still reports moderate amount of pain over the medial knee.  This is often described as sharp and hurt towards the knee is strained.  Has been icing and takes Celebrex  daily for arthritis.  Denies any other treatments at this time.  No previous knee injuries.  He enjoys playing golf.   Surgical History:   None  PMH/PSH/Family History/Social History/Meds/Allergies:    Past Medical History:  Diagnosis Date   Allergy    Hyperlipidemia    Hypertension    RA (rheumatoid arthritis) (HCC)    Past Surgical History:  Procedure Laterality Date   HERNIA REPAIR     Social History   Socioeconomic History   Marital status: Married    Spouse name: Not on file   Number of children: Not on file   Years of education: Not on file   Highest education level: Not on file  Occupational History   Occupation: TE Psychologist, Prison And Probation Services: [employer]  Tobacco Use   Smoking status: Never   Smokeless tobacco: Never  Substance and Sexual Activity   Alcohol use: Yes   Drug use: No   Sexual activity: Yes    Partners: Female  Other Topics Concern   Not on file  Social History Narrative   Exercise--- 2-3 x a week   Social Drivers of Manufacturing Engineer Strain: Not on file  Food Insecurity: Not on file  Transportation Needs: Not on file  Physical Activity: Not on file  Stress: Not on file  Social Connections: Not on file   Family History  Problem Relation Age of Onset   Cancer Father        Oral --angioplasty expired from cancer   Diabetes Father    Dementia Mother    Colon cancer Neg Hx    Esophageal cancer Neg Hx    Rectal cancer Neg Hx    Stomach cancer Neg Hx    No Known Allergies Current Outpatient Medications  Medication Sig Dispense Refill   meloxicam  (MOBIC ) 15 MG tablet Take 1 tablet (15 mg total) by mouth daily for 14 days. 14 tablet 0   atorvastatin  (LIPITOR) 20 MG tablet TAKE 1 TABLET DAILY. 90 tablet 1   celecoxib  (CELEBREX ) 100 MG capsule Take 1 capsule (100 mg total) by mouth daily. 90 capsule 1   folic acid (FOLVITE) 1 MG tablet Take 1 mg by mouth daily.     methotrexate (RHEUMATREX) 2.5 MG tablet Take 15 mg by mouth every Monday.  sildenafil  (REVATIO ) 20 MG tablet Take 3 tablets by mouth daily as needed. 90 tablet 5   No current facility-administered medications for this visit.   No results found.  Review of Systems:   A ROS was performed including pertinent positives and negatives as documented in the HPI.  Physical Exam :   Constitutional: NAD and appears stated age Neurological: Alert and oriented Psych: Appropriate affect and cooperative There were no vitals taken for this visit.   Comprehensive Musculoskeletal Exam:    Exam of the left knee is negative for any notable swelling, ecchymosis, or obvious deformity.  Tenderness over the medial femoral condyle at the adductor tubercle.  Full knee ROM with flexion extension.  Imaging:   MRI left knee: No evidence of ligamentous, bony, or meniscal injury   I personally reviewed and interpreted the radiographs.  Assessment:   68 y.o. male with continued medial left knee pain after an direct impact mechanism now over 2  months ago.  Review of MRI today does not demonstrate any evidence of significant bony injury.  Given his continued tenderness over the abductor tubercle I suspect there is underlying microfracture at this site which I discussed can take many months to heal.  I would like to start him on another short course of meloxicam  to help with his symptoms.  Assured that this should slowly continue to improve.  He will continue to assess symptoms and if these persist, could consider referral to Dr. Burnetta for shockwave therapy.  Plan :    - Start 2 week course of Meloxicam  15 mg - Return to clinic as needed     I personally saw and evaluated the patient, and participated in the management and treatment plan.  Leonce Reveal, PA-C Orthopedics

## 2023-07-19 DIAGNOSIS — M7711 Lateral epicondylitis, right elbow: Secondary | ICD-10-CM | POA: Diagnosis not present

## 2023-07-19 DIAGNOSIS — M65342 Trigger finger, left ring finger: Secondary | ICD-10-CM | POA: Diagnosis not present

## 2023-07-19 DIAGNOSIS — Z6826 Body mass index (BMI) 26.0-26.9, adult: Secondary | ICD-10-CM | POA: Diagnosis not present

## 2023-07-19 DIAGNOSIS — Z79899 Other long term (current) drug therapy: Secondary | ICD-10-CM | POA: Diagnosis not present

## 2023-07-19 DIAGNOSIS — M1811 Unilateral primary osteoarthritis of first carpometacarpal joint, right hand: Secondary | ICD-10-CM | POA: Diagnosis not present

## 2023-07-19 DIAGNOSIS — E663 Overweight: Secondary | ICD-10-CM | POA: Diagnosis not present

## 2023-07-19 DIAGNOSIS — M5416 Radiculopathy, lumbar region: Secondary | ICD-10-CM | POA: Diagnosis not present

## 2023-07-19 DIAGNOSIS — M0609 Rheumatoid arthritis without rheumatoid factor, multiple sites: Secondary | ICD-10-CM | POA: Diagnosis not present

## 2023-10-09 ENCOUNTER — Other Ambulatory Visit: Payer: Self-pay | Admitting: Family Medicine

## 2023-10-09 DIAGNOSIS — E785 Hyperlipidemia, unspecified: Secondary | ICD-10-CM

## 2023-10-09 DIAGNOSIS — M5441 Lumbago with sciatica, right side: Secondary | ICD-10-CM

## 2023-10-11 MED ORDER — CELECOXIB 100 MG PO CAPS: 100.0000 mg | ORAL_CAPSULE | Freq: Every day | ORAL | 1 refills | Status: DC

## 2023-10-11 NOTE — Telephone Encounter (Signed)
 Appt scheduled for 10/16/23 at 11:20am for 6 month f/u.  Refills sent to pharmacy. Pt notified.

## 2023-10-16 ENCOUNTER — Ambulatory Visit: Admitting: Family Medicine

## 2023-10-24 ENCOUNTER — Ambulatory Visit (INDEPENDENT_AMBULATORY_CARE_PROVIDER_SITE_OTHER): Admitting: Family Medicine

## 2023-10-24 ENCOUNTER — Encounter: Payer: Self-pay | Admitting: Family Medicine

## 2023-10-24 VITALS — BP 138/82 | HR 52 | Temp 97.6°F | Ht 67.0 in | Wt 171.8 lb

## 2023-10-24 DIAGNOSIS — R972 Elevated prostate specific antigen [PSA]: Secondary | ICD-10-CM | POA: Diagnosis not present

## 2023-10-24 DIAGNOSIS — M0609 Rheumatoid arthritis without rheumatoid factor, multiple sites: Secondary | ICD-10-CM | POA: Diagnosis not present

## 2023-10-24 DIAGNOSIS — E785 Hyperlipidemia, unspecified: Secondary | ICD-10-CM

## 2023-10-24 NOTE — Assessment & Plan Note (Signed)
 Encourage heart healthy diet such as MIND or DASH diet, increase exercise, avoid trans fats, simple carbohydrates and processed foods, consider a krill or fish or flaxseed oil cap daily.

## 2023-10-24 NOTE — Patient Instructions (Signed)

## 2023-10-24 NOTE — Progress Notes (Signed)
 +  Established Patient Office Visit  Subjective   Patient ID: Timothy Huynh, male    DOB: February 08, 1956  Age: 68 y.o. MRN: [id number]  Chief Complaint  Patient presents with   Medical Management of Chronic Issues    Pt is here for his 6 months f/u. Did a manual pulse. Read 55 bmp    HPI Discussed the use of AI scribe software for clinical note transcription with the patient, who gave verbal consent to proceed.  History of Present Illness Timothy Huynh is a 68 year old male with rheumatoid arthritis who presents for follow-up on his medication regimen and hand symptoms.  He has been on a stable regimen of methotrexate and Celebrex  for several years to manage his rheumatoid arthritis. He generally feels fine but occasionally experiences stiffness in his hands, particularly when it is cold or after being very active.  He has developed a trigger finger, which is most bothersome at night when it 'gets stuck' and he has to straighten it out. He also has multiple cysts in his hand, which have been discussed previously, but he is unsure if they contribute to his symptoms. He has not sought further treatment for these issues yet.  He recently visited his rheumatologist for blood work but has not had blood work done at this facility yet. He mentions that his blood pressure was borderline high today, and that he recently donated blood to the [employer], but it has not been a consistent issue in the past.  He does not require any medication refills at this time as his prescriptions were refilled last week.   Patient Active Problem List   Diagnosis Date Noted   Hyperlipidemia 04/22/2019   Erectile dysfunction 04/22/2019   Rheumatoid arthritis involving multiple sites with positive rheumatoid factor (HCC) 03/14/2016   Preventative health care 03/14/2016   Multiple joint pain 02/11/2013   Sinusitis 05/24/2012   Allergy 03/03/2010   Benign neoplasm of skin 02/17/2010   ERECTILE DYSFUNCTION, NON-ORGANIC  02/17/2010   LOW BACK PAIN, ACUTE 12/14/2009   SKIN RASH 12/22/2008   Hyperlipidemia LDL goal <100 08/29/2008   ELEVATED BLOOD PRESSURE WITHOUT DIAGNOSIS OF HYPERTENSION 09/18/2007   SYNCOPE 09/11/2007   URTICARIA 07/06/2007   HERNIORRHAPHY, HX OF 07/06/2007   Past Medical History:  Diagnosis Date   Allergy    Hyperlipidemia    Hypertension    RA (rheumatoid arthritis) (HCC)    Past Surgical History:  Procedure Laterality Date   HERNIA REPAIR     Social History   Tobacco Use   Smoking status: Never   Smokeless tobacco: Never  Substance Use Topics   Alcohol use: Yes   Drug use: No   Social History   Socioeconomic History   Marital status: Married    Spouse name: Not on file   Number of children: Not on file   Years of education: Not on file   Highest education level: Not on file  Occupational History   Occupation: TE Psychologist, prison and probation services: [employer]  Tobacco Use   Smoking status: Never   Smokeless tobacco: Never  Substance and Sexual Activity   Alcohol use: Yes   Drug use: No   Sexual activity: Yes    Partners: Female  Other Topics Concern   Not on file  Social History Narrative   Exercise--- 2-3 x a week   Social Drivers of Corporate investment banker Strain: Not on file  Food Insecurity: Not on file  Transportation Needs:  Not on file  Physical Activity: Not on file  Stress: Not on file  Social Connections: Not on file  Intimate Partner Violence: Not on file   Family Status  Relation Name Status   Father  Deceased at age 30   Mother  Deceased at age 17   Sister  Alive   Brother  Alive   Sister  Alive   Neg Hx  (Not Specified)  No partnership data on file   Family History  Problem Relation Age of Onset   Cancer Father        Oral --angioplasty expired from cancer   Diabetes Father    Dementia Mother    Colon cancer Neg Hx    Esophageal cancer Neg Hx    Rectal cancer Neg Hx    Stomach cancer Neg Hx    No Known Allergies     Review of Systems  Constitutional:  Negative for fever and malaise/fatigue.  HENT:  Negative for congestion.   Eyes:  Negative for blurred vision.  Respiratory:  Negative for cough and shortness of breath.   Cardiovascular:  Negative for chest pain, palpitations and leg swelling.  Gastrointestinal:  Negative for vomiting.  Musculoskeletal:  Negative for back pain.  Skin:  Negative for rash.  Neurological:  Negative for loss of consciousness and headaches.      Objective:     BP 138/82 (BP Location: Right Arm, Patient Position: Sitting, Cuff Size: Large)   Pulse (!) 52 Comment: pulse ox. manual read 55bmp  Temp 97.6 F (36.4 C) (Oral)   Ht 5\' 7"  (1.702 m)   Wt 171 lb 12.8 oz (77.9 kg)   SpO2 98%   BMI 26.91 kg/m  BP Readings from Last 3 Encounters:  10/24/23 138/82  12/20/22 138/80  03/22/22 120/88   Wt Readings from Last 3 Encounters:  10/24/23 171 lb 12.8 oz (77.9 kg)  12/20/22 169 lb 3.2 oz (76.7 kg)  03/22/22 173 lb 3.2 oz (78.6 kg)   SpO2 Readings from Last 3 Encounters:  10/24/23 98%  12/20/22 98%  03/22/22 98%      Physical Exam Vitals and nursing note reviewed.  Constitutional:      General: He is not in acute distress.    Appearance: Normal appearance. He is well-developed.  HENT:     Head: Normocephalic and atraumatic.  Eyes:     General: No scleral icterus.       Right eye: No discharge.        Left eye: No discharge.  Cardiovascular:     Rate and Rhythm: Normal rate and regular rhythm.     Heart sounds: No murmur heard. Pulmonary:     Effort: Pulmonary effort is normal. No respiratory distress.     Breath sounds: Normal breath sounds.  Musculoskeletal:        General: Normal range of motion.     Cervical back: Normal range of motion and neck supple.     Right lower leg: No edema.     Left lower leg: No edema.  Skin:    General: Skin is warm and dry.  Neurological:     Mental Status: He is alert and oriented to person, place, and time.   Psychiatric:        Mood and Affect: Mood normal.        Behavior: Behavior normal.        Thought Content: Thought content normal.        Judgment: Judgment normal.  No results found for any visits on 10/24/23.  Last CBC Lab Results  Component Value Date   WBC 5.2 12/20/2022   HGB 14.5 12/20/2022   HCT 43.9 12/20/2022   MCV 96.6 12/20/2022   MCH 32.6 03/19/2021   RDW 13.5 12/20/2022   PLT 354.0 12/20/2022   Last metabolic panel Lab Results  Component Value Date   GLUCOSE 116 (H) 12/20/2022   NA 138 12/20/2022   K 4.6 12/20/2022   CL 102 12/20/2022   CO2 26 12/20/2022   BUN 14 12/20/2022   CREATININE 0.84 12/20/2022   GFR 91.15 12/20/2022   CALCIUM  9.8 12/20/2022   PROT 7.3 12/20/2022   ALBUMIN 4.8 12/20/2022   BILITOT 0.7 12/20/2022   ALKPHOS 75 12/20/2022   AST 27 12/20/2022   ALT 43 12/20/2022   ANIONGAP 10 03/16/2016   Last lipids Lab Results  Component Value Date   CHOL 159 12/20/2022   HDL 54.80 12/20/2022   LDLCALC 79 12/20/2022   LDLDIRECT 123.0 03/14/2016   TRIG 130.0 12/20/2022   CHOLHDL 3 12/20/2022   Last hemoglobin A1c Lab Results  Component Value Date   HGBA1C 5.8 06/16/2016   Last thyroid  functions Lab Results  Component Value Date   TSH 2.98 12/20/2022   Last vitamin D  No results found for: "25OHVITD2", "25OHVITD3", "VD25OH" Last vitamin B12 and Folate No results found for: "VITAMINB12", "FOLATE"    The 10-year ASCVD risk score (Arnett DK, et al., 2019) is: 12.7%    Assessment & Plan:   Problem List Items Addressed This Visit       Unprioritized   Hyperlipidemia - Primary   Encourage heart healthy diet such as MIND or DASH diet, increase exercise, avoid trans fats, simple carbohydrates and processed foods, consider a krill or fish or flaxseed oil cap daily.        Relevant Orders   Lipid panel   PSA   TSH   Comprehensive metabolic panel with GFR   CBC with Differential/Platelet   Other Visit Diagnoses        PSA elevation       Relevant Orders   PSA     Assessment and Plan Assessment & Plan Trigger Finger   Intermittent trigger finger occurs primarily at night, with episodes requiring manual straightening. The condition is not severe enough for specialist intervention currently, though hand cysts may contribute. Consider nocturnal splinting to prevent the finger from getting stuck. Discuss potential interventions such as joint injections or cyst aspiration/removal if symptoms worsen. Refer to Dr. Aloha Arnold or Dr. Ortman at [facility] for further evaluation if needed.  Rheumatoid Arthritis   Rheumatoid arthritis is managed with methotrexate and Celebrex . He experiences occasional stiffness in the hands, especially in cold weather or post-activity, but the condition is well-managed with the current regimen.  Hypertension   He has borderline hypertension likely due to recent stress from rushing and blood work. Historically, blood pressure has not been consistently elevated.  Elevated PSA   Previous PSA levels were elevated compared to the prior year. Plans are in place to repeat the PSA test to monitor for changes, though lab results may be delayed due to current lab issues.    Return in about 6 months (around 04/24/2024), or if symptoms worsen or fail to improve, for annual exam, fasting.    Nivan Melendrez R Lowne Chase, DO

## 2023-10-30 ENCOUNTER — Ambulatory Visit: Payer: Self-pay | Admitting: Family Medicine

## 2023-11-07 ENCOUNTER — Ambulatory Visit: Admitting: Family Medicine

## 2024-01-02 ENCOUNTER — Other Ambulatory Visit: Payer: Self-pay | Admitting: Family Medicine

## 2024-01-02 DIAGNOSIS — E785 Hyperlipidemia, unspecified: Secondary | ICD-10-CM

## 2024-01-17 DIAGNOSIS — Z79899 Other long term (current) drug therapy: Secondary | ICD-10-CM | POA: Diagnosis not present

## 2024-01-17 DIAGNOSIS — Z6825 Body mass index (BMI) 25.0-25.9, adult: Secondary | ICD-10-CM | POA: Diagnosis not present

## 2024-01-17 DIAGNOSIS — M5416 Radiculopathy, lumbar region: Secondary | ICD-10-CM | POA: Diagnosis not present

## 2024-01-17 DIAGNOSIS — M0609 Rheumatoid arthritis without rheumatoid factor, multiple sites: Secondary | ICD-10-CM | POA: Diagnosis not present

## 2024-01-17 DIAGNOSIS — M65342 Trigger finger, left ring finger: Secondary | ICD-10-CM | POA: Diagnosis not present

## 2024-01-17 DIAGNOSIS — M1811 Unilateral primary osteoarthritis of first carpometacarpal joint, right hand: Secondary | ICD-10-CM | POA: Diagnosis not present

## 2024-01-17 DIAGNOSIS — E663 Overweight: Secondary | ICD-10-CM | POA: Diagnosis not present

## 2024-01-17 DIAGNOSIS — M7711 Lateral epicondylitis, right elbow: Secondary | ICD-10-CM | POA: Diagnosis not present

## 2024-02-20 ENCOUNTER — Ambulatory Visit (INDEPENDENT_AMBULATORY_CARE_PROVIDER_SITE_OTHER)

## 2024-02-20 ENCOUNTER — Other Ambulatory Visit (HOSPITAL_BASED_OUTPATIENT_CLINIC_OR_DEPARTMENT_OTHER): Payer: Self-pay

## 2024-02-20 ENCOUNTER — Telehealth: Payer: Self-pay

## 2024-02-20 DIAGNOSIS — Z23 Encounter for immunization: Secondary | ICD-10-CM | POA: Diagnosis not present

## 2024-02-20 MED ORDER — COMIRNATY 30 MCG/0.3ML IM SUSY
0.3000 mL | PREFILLED_SYRINGE | Freq: Once | INTRAMUSCULAR | 0 refills | Status: AC
  Filled 2024-02-20: qty 0.3, 1d supply, fill #0

## 2024-02-20 NOTE — Telephone Encounter (Signed)
 Error CRM sent. Covid vaccine prescriptions are not required for Pt's 67 years of age and older.

## 2024-02-20 NOTE — Telephone Encounter (Signed)
 Copied from CRM #[id number]. Topic: Clinical - Medication Question >> Feb 20, 2024  9:08 AM Suzen RAMAN wrote: Reason for CRM:  patient would like a prescription for the  Covid Vaccine sent to [facility] [address] [place] [address]

## 2024-02-21 ENCOUNTER — Ambulatory Visit

## 2024-03-18 ENCOUNTER — Encounter: Payer: Self-pay | Admitting: Radiology

## 2024-03-21 DIAGNOSIS — H524 Presbyopia: Secondary | ICD-10-CM | POA: Diagnosis not present

## 2024-03-28 ENCOUNTER — Telehealth: Payer: Self-pay | Admitting: Family Medicine

## 2024-03-28 NOTE — Telephone Encounter (Signed)
 Copied from CRM [id number]. Topic: Medicare AWV >> Mar 28, 2024  2:36 PM Nathanel DEL wrote: Called LVM 03/28/2024 to schedule AWV. Please schedule office or virtual visits  Nathanel Paschal; Care Guide Ambulatory Clinical Support [place] [facility] Direct Dial: [phone]

## 2024-04-14 ENCOUNTER — Other Ambulatory Visit: Payer: Self-pay | Admitting: Family Medicine

## 2024-04-14 DIAGNOSIS — M5441 Lumbago with sciatica, right side: Secondary | ICD-10-CM

## 2024-04-17 DIAGNOSIS — M0609 Rheumatoid arthritis without rheumatoid factor, multiple sites: Secondary | ICD-10-CM | POA: Diagnosis not present
